# Patient Record
Sex: Female | Born: 1990 | Race: Black or African American | Hispanic: No | Marital: Married | Smoking: Never smoker
Health system: Southern US, Community
[De-identification: ages and names within clinical notes are randomized; demographics above are authoritative.]

## PROBLEM LIST (undated history)

## (undated) DIAGNOSIS — Z789 Other specified health status: Secondary | ICD-10-CM

## (undated) HISTORY — DX: Other specified health status: Z78.9

## (undated) HISTORY — PX: NO PAST SURGERIES: SHX2092

---

## 2019-04-26 ENCOUNTER — Ambulatory Visit: Payer: Self-pay | Attending: Family

## 2019-04-26 DIAGNOSIS — Z23 Encounter for immunization: Secondary | ICD-10-CM

## 2019-04-26 NOTE — Progress Notes (Signed)
   Covid-19 Vaccination Clinic  Name:  Leah Vargas    MRN: [id number] DOB: Jul 16, 1990  04/26/2019  Leah Vargas was observed post Covid-19 immunization for 15 minutes without incident. She was provided with Vaccine Information Sheet and instruction to access the V-Safe system.   Leah Vargas was instructed to call 911 with any severe reactions post vaccine: Marland Kitchen Difficulty breathing  . Swelling of face and throat  . A fast heartbeat  . A bad rash all over body  . Dizziness and weakness   Immunizations Administered    Name Date Dose VIS Date Route   Moderna COVID-19 Vaccine 04/26/2019 12:25 PM 0.5 mL 01/02/2019 Intramuscular   Manufacturer: Moderna   Lot: [id number]   NDC: 33825-053-97

## 2019-05-29 ENCOUNTER — Ambulatory Visit: Payer: Self-pay | Attending: Family

## 2019-05-29 DIAGNOSIS — Z23 Encounter for immunization: Secondary | ICD-10-CM

## 2019-05-29 NOTE — Progress Notes (Signed)
   Covid-19 Vaccination Clinic  Name:  [name]    MRN: [id number] DOB: [birth year]  05/29/2019  Leah Vargas was observed post Covid-19 immunization for 15 minutes without incident. She was provided with Vaccine Information Sheet and instruction to access the V-Safe system.   Leah Vargas was instructed to call 911 with any severe reactions post vaccine: Marland Kitchen Difficulty breathing  . Swelling of face and throat  . A fast heartbeat  . A bad rash all over body  . Dizziness and weakness   Immunizations Administered    Name Date Dose VIS Date Route   Moderna COVID-19 Vaccine 05/29/2019 12:08 PM 0.5 mL 01/2019 Intramuscular   Manufacturer: Moderna   Lot: [id number]   NDC: 77116-579-03

## 2020-10-05 ENCOUNTER — Encounter (HOSPITAL_COMMUNITY): Payer: Self-pay | Admitting: *Deleted

## 2020-10-05 ENCOUNTER — Ambulatory Visit (HOSPITAL_COMMUNITY)
Admission: EM | Admit: 2020-10-05 | Discharge: 2020-10-05 | Disposition: A | Discharge status: Home / Self Care | Payer: BC Managed Care – PPO

## 2020-10-05 DIAGNOSIS — Z3A01 Less than 8 weeks gestation of pregnancy: Secondary | ICD-10-CM | POA: Diagnosis not present

## 2020-10-05 DIAGNOSIS — O219 Vomiting of pregnancy, unspecified: Secondary | ICD-10-CM | POA: Diagnosis not present

## 2020-10-05 MED ORDER — ONDANSETRON 4 MG PO TBDP: 4.0000 mg | ORAL_TABLET | Freq: Three times a day (TID) | ORAL | 0 refills | Status: DC | PRN

## 2020-10-05 NOTE — Discharge Instructions (Addendum)
Take the zofran as needed for nausea and vomiting.   You can take Tylenol as needed for pain. Do not take and ibuprofen or naproxen. Refer to the sheet of medications that are safe to take during pregnancy.   Try to eat small, frequent meals to help with nausea.  You can use ginger (ginger ale, ginger candy) and mint for nausea.    Make sure you are drinking plenty of fluids, such as water, powerade/gatorade, pedialyte, juices, and teas.    Go to the emergency department for further evaluation of any worsening symptoms.  If you develop any abdominal pain, vaginal bleeding, or spotting, please go to the Maternal Admissions Unit.   Follow up with your OBGYN as scheduled.

## 2020-10-05 NOTE — ED Provider Notes (Signed)
[facility]    CSN: [id number] Arrival date & time: 10/05/20  1059      History   Chief Complaint Chief Complaint  Patient presents with   Emesis    Reports [redacted] wks pregnant   Dizziness    HPI Leah Vargas is a 30 y.o. female.   Yellow great thank you patient here for evaluation of vomiting and weakness that have gotten progressively worse over the past several weeks.  Patient does report being about [redacted] weeks pregnant with a LMP of 08/12/2020.  Reports being unable to keep anything down other than water.  Denies any abdominal pain or cramping, vaginal bleeding, or spotting.  Denies any trauma, injury, or other precipitating event.  Denies any specific alleviating or aggravating factors.  Denies any fevers, chest pain, shortness of breath, numbness, tingling, abdominal pain, or headaches.    The history is provided by the patient.  Emesis Dizziness Associated symptoms: nausea and vomiting    History reviewed. No pertinent past medical history.  There are no problems to display for this patient.   History reviewed. No pertinent surgical history.  OB History     Gravida  2   Para  0   Term      Preterm      AB      Living         SAB      IAB      Ectopic      Multiple      Live Births               Home Medications    Prior to Admission medications   Medication Sig Start Date End Date Taking? Authorizing Provider  ondansetron (ZOFRAN ODT) 4 MG disintegrating tablet Take 1 tablet (4 mg total) by mouth every 8 (eight) hours as needed for nausea or vomiting. 10/05/20  Yes Ivette Loyal, NP  Prenatal Vit-Fe Fumarate-FA (PRENATAL VITAMINS PO) Take by mouth. States hasn't been able to keep them down this past wk   Yes [provider]    Family History Family History  Problem Relation Age of Onset   Healthy Mother    Healthy Father     Social History Social History   Tobacco Use   Smoking status: Never   Smokeless  tobacco: Never  Vaping Use   Vaping Use: Never used  Substance Use Topics   Alcohol use: Not Currently   Drug use: Never     Allergies   Patient has no known allergies.   Review of Systems Review of Systems  Gastrointestinal:  Positive for nausea and vomiting.  Neurological:  Positive for dizziness.  All other systems reviewed and are negative.   Physical Exam Triage Vital Signs ED Triage Vitals  Enc Vitals Group     BP 10/05/20 1150 105/68     Pulse Rate 10/05/20 1150 81     Resp --      Temp 10/05/20 1150 99.2 F (37.3 C)     Temp Source 10/05/20 1150 Oral     SpO2 10/05/20 1150 99 %     Weight --      Height --      Head Circumference --      Peak Flow --      Pain Score 10/05/20 1155 0     Pain Loc --      Pain Edu? --      Excl. in GC? --  No data found.  Updated Vital Signs BP 105/68 (BP Location: Right Arm)   Pulse 81   Temp 99.2 F (37.3 C) (Oral)   LMP 08/12/2020 (Exact Date)   SpO2 99%   Visual Acuity Right Eye Distance:   Left Eye Distance:   Bilateral Distance:    Right Eye Near:   Left Eye Near:    Bilateral Near:     Physical Exam Vitals and nursing note reviewed.  Constitutional:      General: She is not in acute distress.    Appearance: Normal appearance. She is not ill-appearing, toxic-appearing or diaphoretic.  HENT:     Head: Normocephalic and atraumatic.  Eyes:     Conjunctiva/sclera: Conjunctivae normal.  Cardiovascular:     Rate and Rhythm: Normal rate.     Pulses: Normal pulses.     Heart sounds: Normal heart sounds.  Pulmonary:     Effort: Pulmonary effort is normal.     Breath sounds: Normal breath sounds.  Abdominal:     General: Abdomen is flat.  Musculoskeletal:        General: Normal range of motion.     Cervical back: Normal range of motion.  Skin:    General: Skin is warm and dry.  Neurological:     General: No focal deficit present.     Mental Status: She is alert and oriented to person, place, and  time.  Psychiatric:        Mood and Affect: Mood normal.     UC Treatments / Results  Labs (all labs ordered are listed, but only abnormal results are displayed) Labs Reviewed - No data to display  EKG   Radiology No results found.  Procedures Procedures (including critical care time)  Medications Ordered in UC Medications - No data to display  Initial Impression / Assessment and Plan / UC Course  I have reviewed the triage vital signs and the nursing notes.  Pertinent labs & imaging results that were available during my care of the patient were reviewed by me and considered in my medical decision making (see chart for details).    Assessment negative for red flags or concerns.  Nausea and vomiting during pregnancy.  May take Zofran as needed to help with nausea and vomiting.  May take Tylenol as needed for pain.  Instructed not to take NSAIDs while pregnant.  Patient given list of medications that are safe to take during pregnancy to help with symptoms.  Recommend eating small frequent meals and can use ginger or mint to help with nausea.  Encourage fluids and rest.  If symptoms do not improve or worsen patient may go to the emergency room for further evaluation.  Patient instructed to go to MAU for any abdominal pain, vaginal bleeding or spotting.  Follow-up with OB/GYN as scheduled Final Clinical Impressions(s) / UC Diagnoses   Final diagnoses:  Nausea and vomiting during pregnancy     Discharge Instructions      Take the zofran as needed for nausea and vomiting.   You can take Tylenol as needed for pain. Do not take and ibuprofen or naproxen. Refer to the sheet of medications that are safe to take during pregnancy.   Try to eat small, frequent meals to help with nausea.  You can use ginger (ginger ale, ginger candy) and mint for nausea.    Make sure you are drinking plenty of fluids, such as water, powerade/gatorade, pedialyte, juices, and teas.    Go to  the  emergency department for further evaluation of any worsening symptoms.  If you develop any abdominal pain, vaginal bleeding, or spotting, please go to the Maternal Admissions Unit.   Follow up with your OBGYN as scheduled.      ED Prescriptions     Medication Sig Dispense Auth. Provider   ondansetron (ZOFRAN ODT) 4 MG disintegrating tablet Take 1 tablet (4 mg total) by mouth every 8 (eight) hours as needed for nausea or vomiting. 20 tablet Ivette Loyal, NP      PDMP not reviewed this encounter.   Ivette Loyal, NP 10/05/20 1226

## 2020-10-05 NOTE — ED Triage Notes (Signed)
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 C/O dizziness, vomiting intermittently x 2 wks with progressive worsening over the past week.  States now unable to keep down any PO fluids now.  States has OB appt scheduled.

## 2020-10-16 ENCOUNTER — Inpatient Hospital Stay (HOSPITAL_COMMUNITY)
Admission: EM | Admit: 2020-10-16 | Discharge: 2020-10-17 | Disposition: A | Discharge status: Home / Self Care | Payer: BC Managed Care – PPO | Attending: Family Medicine | Admitting: Family Medicine

## 2020-10-16 ENCOUNTER — Encounter (HOSPITAL_COMMUNITY): Payer: Self-pay

## 2020-10-16 ENCOUNTER — Other Ambulatory Visit: Payer: Self-pay

## 2020-10-16 ENCOUNTER — Ambulatory Visit (HOSPITAL_COMMUNITY)
Admission: EM | Admit: 2020-10-16 | Discharge: 2020-10-16 | Disposition: A | Discharge status: Home / Self Care | Payer: BC Managed Care – PPO

## 2020-10-16 DIAGNOSIS — K920 Hematemesis: Secondary | ICD-10-CM | POA: Insufficient documentation

## 2020-10-16 DIAGNOSIS — O26891 Other specified pregnancy related conditions, first trimester: Secondary | ICD-10-CM | POA: Diagnosis present

## 2020-10-16 DIAGNOSIS — Z3A09 9 weeks gestation of pregnancy: Secondary | ICD-10-CM | POA: Diagnosis not present

## 2020-10-16 DIAGNOSIS — O99281 Endocrine, nutritional and metabolic diseases complicating pregnancy, first trimester: Secondary | ICD-10-CM | POA: Diagnosis not present

## 2020-10-16 DIAGNOSIS — O219 Vomiting of pregnancy, unspecified: Secondary | ICD-10-CM | POA: Diagnosis not present

## 2020-10-16 DIAGNOSIS — Z79899 Other long term (current) drug therapy: Secondary | ICD-10-CM | POA: Insufficient documentation

## 2020-10-16 DIAGNOSIS — E860 Dehydration: Secondary | ICD-10-CM | POA: Insufficient documentation

## 2020-10-16 DIAGNOSIS — O211 Hyperemesis gravidarum with metabolic disturbance: Secondary | ICD-10-CM | POA: Diagnosis not present

## 2020-10-16 LAB — I-STAT BETA HCG BLOOD, ED (MC, WL, AP ONLY): I-stat hCG, quantitative: >2000 m[IU]/mL — ABNORMAL HIGH (ref <5)

## 2020-10-16 MED ORDER — LACTATED RINGERS IV SOLN: Freq: Once | INTRAVENOUS | Status: AC

## 2020-10-16 MED ORDER — SODIUM CHLORIDE 0.9 % IV SOLN
12.5000 mg | INTRAVENOUS | Status: AC
  Administered 2020-10-16: 12.5 mg via INTRAVENOUS
  Filled 2020-10-16: qty 0.5

## 2020-10-16 NOTE — MAU Provider Note (Signed)
Chief Complaint: Nausea and Hematemesis   Event Date/Time   First Provider Initiated Contact with Patient 10/16/20 2224        SUBJECTIVE HPI: Leah Vargas is a 30 y.o. G1P0 at [redacted]w[redacted]d by LMP who presents to maternity admissions reporting nausea and vomiting for 3 weeks.  Was taking Zofran but ran out of it.  . She denies vaginal bleeding, vaginal itching/burning, urinary symptoms, h/a, dizziness, or fever/chills.    Emesis  This is a recurrent problem. The current episode started 1 to 4 weeks ago. There has been no fever. Pertinent negatives include no abdominal pain, chest pain, chills, coughing, diarrhea, dizziness, fever, headaches or myalgias. Treatments tried: zofran but it ran out.  RN Note: Pt transferred from [facility] with c/o N/V. Pt reports she has had N/V x 3 weeks. Was seen in urgent care and given zofran that helped for a bout a week then it stopped working. Can't keep anything down. Feels week. Stated she has had blood streaking in her emesis as well.   History reviewed. No pertinent past medical history. History reviewed. No pertinent surgical history. Social History   Socioeconomic History   Marital status: Single    Spouse name: Not on file   Number of children: Not on file   Years of education: Not on file   Highest education level: Not on file  Occupational History   Not on file  Tobacco Use   Smoking status: Never   Smokeless tobacco: Never  Vaping Use   Vaping Use: Never used  Substance and Sexual Activity   Alcohol use: Not Currently   Drug use: Never   Sexual activity: Not Currently    Comment: reports currently pregnant  Other Topics Concern   Not on file  Social History Narrative   Not on file   Social Determinants of Health   Financial Resource Strain: Not on file  Food Insecurity: Not on file  Transportation Needs: Not on file  Physical Activity: Not on file  Stress: Not on file  Social Connections: Not on file  Intimate Partner Violence:  Not on file   No current facility-administered medications on file prior to encounter.   Current Outpatient Medications on File Prior to Encounter  Medication Sig Dispense Refill   ondansetron (ZOFRAN ODT) 4 MG disintegrating tablet Take 1 tablet (4 mg total) by mouth every 8 (eight) hours as needed for nausea or vomiting. 20 tablet 0   Prenatal Vit-Fe Fumarate-FA (PRENATAL VITAMINS PO) Take by mouth. States hasn't been able to keep them down this past wk     No Known Allergies  I have reviewed patient's Past Medical Hx, Surgical Hx, Family Hx, Social Hx, medications and allergies.   ROS:  Review of Systems  Constitutional:  Negative for chills and fever.  Respiratory:  Negative for cough.   Cardiovascular:  Negative for chest pain.  Gastrointestinal:  Positive for vomiting. Negative for abdominal pain and diarrhea.  Musculoskeletal:  Negative for myalgias.  Neurological:  Negative for dizziness and headaches.  Review of Systems  Other systems negative   Physical Exam  Physical Exam Patient Vitals for the past 24 hrs:  BP Temp Temp src Pulse Resp SpO2  10/16/20 2217 130/69 -- Oral 79 20 100 %  10/16/20 2203 (!) 141/84 -- -- 97 18 --  10/16/20 1944 (!) 107/96 98.3 F (36.8 C) -- (!) 116 20 100 %   Constitutional: Well-developed, well-nourished female in no acute distress.  Cardiovascular: normal rate with intermittent  tachycardia Respiratory: normal effort GI: Abd soft, non-tender. Pos BS x 4 MS: Extremities nontender, no edema, normal ROM Neurologic: Alert and oriented x 4.  GU: Neg CVAT.  LAB RESULTS Results for orders placed or performed during the hospital encounter of 10/16/20 (from the past 24 hour(s))  I-Stat beta hCG blood, ED     Status: Abnormal   Collection Time: 10/16/20  8:01 PM  Result Value Ref Range   I-stat hCG, quantitative >2,000.0 (H) <5 mIU/mL   Comment 3               IMAGING Pt informed that the ultrasound is considered a limited OB  ultrasound and is not intended to be a complete ultrasound exam.  Patient also informed that the ultrasound is not being completed with the intent of assessing for fetal or placental anomalies or any pelvic abnormalities.  Explained that the purpose of today's ultrasound is to assess for viability Patient acknowledges the purpose of the exam and the limitations of the study.    Single intrauterine pregnancy seen GS normal  Single embryo measuring [redacted]w[redacted]d FHR 160 ? Membrane seen inside GS  MAU Management/MDM: Ordered IV fluids for rehydration 2 liters given Phenergan 12.5mg  given  Patient got good relief and was without further vomiting States she feels better and wants to go home   ASSESSMENT Single IUP at.[redacted]w[redacted]d Nausea and vomiting Dehydration  PLAN Discharge home Rx Zofran refilled Rx Phenergan for nausea prn at home Advance diet as tolerated Has appt at [facility] 11/10/20  Pt stable at time of discharge. Encouraged to return here if she develops worsening of symptoms, increase in pain, fever, or other concerning symptoms.    Wynelle Bourgeois CNM, MSN Certified Nurse-Midwife 10/16/2020  10:24 PM

## 2020-10-16 NOTE — ED Triage Notes (Signed)
Pt from urgent care d/t nausea and vomiting that's been ongoing for the past 2 weeks. Patient endorses blood in vomit. Patient is currently [redacted] wks pregnant. Patient also endorsing chest pain at this time.

## 2020-10-16 NOTE — MAU Note (Signed)
Pt transferred from [facility] with c/o N/V. Pt reports she has had N/V x 3 weeks. Was seen in urgent care and given zofran that helped for a bout a week then it stopped working. Can't keep anything down. Feels week. Stated she has had blood streaking in her emesis as well.

## 2020-10-16 NOTE — ED Notes (Signed)
Per Forest Becker, MD pt is to be evaluated to ED.

## 2020-10-16 NOTE — ED Provider Notes (Signed)
Emergency Medicine Provider Triage Evaluation Note  [name] , a 30 y.o. female  was evaluated in triage.  Pt complains of nv in pregnancy. States she is about [redacted] weeks pregnant.  Review of Systems  Positive: nv Negative: Vaginal bleeding  Physical Exam  BP (!) 107/96 (BP Location: Left Arm)   Pulse (!) 116   Temp 98.3 F (36.8 C)   Resp 20   LMP 08/12/2020 (Exact Date)   SpO2 100%  Gen:   Awake, no distress   Resp:  Normal effort  MSK:   Moves extremities without difficulty  Other:  Actively vomiting on exam  Medical Decision Making  Medically screening exam initiated at 7:46 PM.  Appropriate orders placed.  Oluwatamilore Duncan Dull was informed that the remainder of the evaluation will be completed by another provider, this initial triage assessment does not replace that evaluation, and the importance of remaining in the ED until their evaluation is complete.  7:46 PM Discussed case with Dr. Lupita Leash who accepts patient for transfer to the MAU pending istat beta hcg   Karrie Meres, PA-C 10/16/20 1948    Mancel Bale, MD 10/18/20 1151

## 2020-10-17 MED ORDER — DEXTROSE IN LACTATED RINGERS 5 % IV SOLN: Freq: Once | INTRAVENOUS | Status: AC

## 2020-10-17 MED ORDER — PROMETHAZINE HCL 25 MG PO TABS: 25.0000 mg | ORAL_TABLET | Freq: Four times a day (QID) | ORAL | 2 refills | Status: DC | PRN

## 2020-10-17 MED ORDER — ONDANSETRON HCL 8 MG PO TABS: 4.0000 mg | ORAL_TABLET | Freq: Three times a day (TID) | ORAL | 0 refills | Status: DC | PRN

## 2020-10-17 NOTE — MAU Note (Signed)
Patient unable to obtain urine. MAU provider notified.

## 2020-10-19 ENCOUNTER — Encounter (HOSPITAL_COMMUNITY): Payer: Self-pay | Admitting: Obstetrics & Gynecology

## 2020-10-19 ENCOUNTER — Other Ambulatory Visit: Payer: Self-pay

## 2020-10-19 ENCOUNTER — Inpatient Hospital Stay (HOSPITAL_COMMUNITY)
Admission: AD | Admit: 2020-10-19 | Discharge: 2020-10-19 | Disposition: A | Discharge status: Home / Self Care | Payer: BC Managed Care – PPO | Attending: Obstetrics & Gynecology | Admitting: Obstetrics & Gynecology

## 2020-10-19 DIAGNOSIS — Z3A09 9 weeks gestation of pregnancy: Secondary | ICD-10-CM | POA: Insufficient documentation

## 2020-10-19 DIAGNOSIS — O210 Mild hyperemesis gravidarum: Secondary | ICD-10-CM | POA: Insufficient documentation

## 2020-10-19 LAB — CBC
HCT: 34.4 % — ABNORMAL LOW (ref 36.0–46.0)
Hemoglobin: 11 g/dL — ABNORMAL LOW (ref 12.0–15.0)
MCH: 23.6 pg — ABNORMAL LOW (ref 26.0–34.0)
MCHC: 32 g/dL (ref 30.0–36.0)
MCV: 73.7 fL — ABNORMAL LOW (ref 80.0–100.0)
Platelets: 309 10*3/uL (ref 150–400)
RBC: 4.67 MIL/uL (ref 3.87–5.11)
RDW: 18.5 % — ABNORMAL HIGH (ref 11.5–15.5)
WBC: 6 10*3/uL (ref 4.0–10.5)
nRBC: 0 % (ref 0.0–0.2)

## 2020-10-19 LAB — URINALYSIS, ROUTINE W REFLEX MICROSCOPIC
Glucose, UA: NEGATIVE mg/dL
Ketones, ur: >80 mg/dL — AB
Nitrite: NEGATIVE
Protein, ur: NEGATIVE mg/dL
Specific Gravity, Urine: >1.03 — ABNORMAL HIGH (ref 1.005–1.030)
pH: 6 (ref 5.0–8.0)

## 2020-10-19 LAB — COMPREHENSIVE METABOLIC PANEL WITH GFR
ALT: 9 U/L (ref 0–44)
AST: 16 U/L (ref 15–41)
Albumin: 3.9 g/dL (ref 3.5–5.0)
Alkaline Phosphatase: 35 U/L — ABNORMAL LOW (ref 38–126)
Anion gap: 15 (ref 5–15)
BUN: 6 mg/dL (ref 6–20)
CO2: 16 mmol/L — ABNORMAL LOW (ref 22–32)
Calcium: 9.6 mg/dL (ref 8.9–10.3)
Chloride: 106 mmol/L (ref 98–111)
Creatinine, Ser: 0.73 mg/dL (ref 0.44–1.00)
GFR, Estimated: >60 mL/min
Glucose, Bld: 70 mg/dL (ref 70–99)
Potassium: 3.3 mmol/L — ABNORMAL LOW (ref 3.5–5.1)
Sodium: 137 mmol/L (ref 135–145)
Total Bilirubin: 0.6 mg/dL (ref 0.3–1.2)
Total Protein: 8.2 g/dL — ABNORMAL HIGH (ref 6.5–8.1)

## 2020-10-19 LAB — URINALYSIS, MICROSCOPIC (REFLEX)

## 2020-10-19 MED ORDER — FAMOTIDINE IN NACL 20-0.9 MG/50ML-% IV SOLN
20.0000 mg | Freq: Once | INTRAVENOUS | Status: AC
  Administered 2020-10-19: 20 mg via INTRAVENOUS
  Filled 2020-10-19: qty 50

## 2020-10-19 MED ORDER — METHYLPREDNISOLONE 16 MG PO TABS: 16.0000 mg | ORAL_TABLET | Freq: Every day | ORAL | 0 refills | Status: DC

## 2020-10-19 MED ORDER — ONDANSETRON 4 MG PO TBDP
8.0000 mg | ORAL_TABLET | Freq: Once | ORAL | Status: AC
  Administered 2020-10-19: 8 mg via ORAL
  Filled 2020-10-19: qty 2

## 2020-10-19 MED ORDER — PROMETHAZINE HCL 25 MG RE SUPP: RECTAL | 1 refills | Status: DC

## 2020-10-19 MED ORDER — METHYLPREDNISOLONE 4 MG PO TABS
4.0000 mg | ORAL_TABLET | Freq: Every day | ORAL | Status: DC
Start: 2020-10-26 — End: 2020-10-20

## 2020-10-19 MED ORDER — SCOPOLAMINE 1 MG/3DAYS TD PT72: 1.0000 | MEDICATED_PATCH | TRANSDERMAL | 12 refills | Status: DC

## 2020-10-19 MED ORDER — PROCHLORPERAZINE EDISYLATE 10 MG/2ML IJ SOLN
10.0000 mg | Freq: Once | INTRAMUSCULAR | Status: AC
  Administered 2020-10-19: 10 mg via INTRAVENOUS
  Filled 2020-10-19: qty 2

## 2020-10-19 MED ORDER — METHYLPREDNISOLONE 4 MG PO TBPK: ORAL_TABLET | ORAL | 0 refills | Status: DC

## 2020-10-19 MED ORDER — METHYLPREDNISOLONE 4 MG PO TABS
8.0000 mg | ORAL_TABLET | Freq: Every day | ORAL | Status: DC
Start: 2020-10-22 — End: 2020-10-20

## 2020-10-19 MED ORDER — METHYLPREDNISOLONE 8 MG PO TABS: 8.0000 mg | ORAL_TABLET | Freq: Every day | ORAL | 0 refills | Status: DC

## 2020-10-19 MED ORDER — METHYLPREDNISOLONE 4 MG PO TABS
4.0000 mg | ORAL_TABLET | Freq: Every day | ORAL | Status: DC
Start: 2020-10-31 — End: 2020-10-20

## 2020-10-19 MED ORDER — METOCLOPRAMIDE HCL 5 MG/ML IJ SOLN
10.0000 mg | Freq: Once | INTRAMUSCULAR | Status: AC
  Administered 2020-10-19: 10 mg via INTRAVENOUS
  Filled 2020-10-19: qty 2

## 2020-10-19 MED ORDER — LACTATED RINGERS IV BOLUS
1000.0000 mL | Freq: Once | INTRAVENOUS | Status: AC
  Administered 2020-10-19: 1000 mL via INTRAVENOUS

## 2020-10-19 MED ORDER — METHYLPREDNISOLONE 4 MG PO TABS: 4.0000 mg | ORAL_TABLET | Freq: Every day | ORAL | Status: DC

## 2020-10-19 MED ORDER — ONDANSETRON 8 MG PO TBDP: 8.0000 mg | ORAL_TABLET | Freq: Three times a day (TID) | ORAL | 0 refills | Status: DC | PRN

## 2020-10-19 MED ORDER — METHYLPREDNISOLONE 16 MG PO TABS: 16.0000 mg | ORAL_TABLET | Freq: Every day | ORAL | Status: DC

## 2020-10-19 MED ORDER — METHYLPREDNISOLONE SODIUM SUCC 125 MG IJ SOLR
48.0000 mg | Freq: Once | INTRAMUSCULAR | Status: AC
Start: 2020-10-19 — End: 2020-10-19
  Administered 2020-10-19: 48 mg via INTRAVENOUS
  Filled 2020-10-19: qty 2

## 2020-10-19 MED ORDER — METHYLPREDNISOLONE 4 MG PO TABS
8.0000 mg | ORAL_TABLET | Freq: Every day | ORAL | Status: DC
Start: 2020-10-23 — End: 2020-10-20

## 2020-10-19 MED ORDER — METHYLPREDNISOLONE 4 MG PO TABS: 4.0000 mg | ORAL_TABLET | Freq: Every day | ORAL | 0 refills | Status: DC

## 2020-10-19 MED ORDER — METHYLPREDNISOLONE 4 MG PO TABS
4.0000 mg | ORAL_TABLET | Freq: Every day | ORAL | 0 refills | Status: DC
Start: 2020-10-26 — End: 2020-10-19

## 2020-10-19 MED ORDER — SCOPOLAMINE 1 MG/3DAYS TD PT72
1.0000 | MEDICATED_PATCH | TRANSDERMAL | Status: DC
  Administered 2020-10-19: 1.5 mg via TRANSDERMAL
  Filled 2020-10-19: qty 1

## 2020-10-19 MED ORDER — METHYLPREDNISOLONE 4 MG PO TABS: 8.0000 mg | ORAL_TABLET | Freq: Every day | ORAL | Status: DC

## 2020-10-19 MED ORDER — PROMETHAZINE HCL 25 MG/ML IJ SOLN
25.0000 mg | Freq: Once | INTRAVENOUS | Status: AC
  Administered 2020-10-19: 25 mg via INTRAVENOUS
  Filled 2020-10-19: qty 1

## 2020-10-19 MED ORDER — LACTATED RINGERS IV SOLN: INTRAVENOUS | Status: DC

## 2020-10-19 NOTE — MAU Provider Note (Signed)
Patient Leah Vargas is a 30 y.o. G1P0  At [redacted]w[redacted]d here with complaints of nausea and vomiting. She was seen in MAU on 10/16/2020; was sent home with Phenergan and zofran but says that she has not been able to keep anything down. She denies fever, vag bleeding, pelvic pain. She denies dsyuria, she denies cough, HA    History     CSN: [id number]  Arrival date and time: 10/19/20 1321   Event Date/Time   First Provider Initiated Contact with Patient 10/19/20 1414      Chief Complaint  Patient presents with   Nausea   Emesis   Emesis  This is a chronic problem. The current episode started in the past 7 days. The problem occurs 5 to 10 times per day. The problem has been gradually worsening. The emesis has an appearance of bile and stomach contents. There has been no fever. Pertinent negatives include no abdominal pain, arthralgias, chest pain, chills, diarrhea, dizziness, fever or headaches. Treatments tried: zofran and phenergan.   OB History     Gravida  1   Para  0   Term      Preterm      AB      Living         SAB      IAB      Ectopic      Multiple      Live Births              History reviewed. No pertinent past medical history.  No past surgical history on file.  Family History  Problem Relation Age of Onset   Healthy Mother    Healthy Father     Social History   Tobacco Use   Smoking status: Never   Smokeless tobacco: Never  Vaping Use   Vaping Use: Never used  Substance Use Topics   Alcohol use: Not Currently   Drug use: Never    Allergies: No Known Allergies  Medications Prior to Admission  Medication Sig Dispense Refill Last Dose   ondansetron (ZOFRAN) 8 MG tablet Take 0.5 tablets (4 mg total) by mouth every 8 (eight) hours as needed for nausea or vomiting. 20 tablet 0    Prenatal Vit-Fe Fumarate-FA (PRENATAL VITAMINS PO) Take by mouth. States hasn't been able to keep them down this past wk      promethazine (PHENERGAN) 25  MG tablet Take 1 tablet (25 mg total) by mouth every 6 (six) hours as needed for nausea or vomiting. 30 tablet 2     Review of Systems  Constitutional:  Negative for chills and fever.  HENT: Negative.    Respiratory: Negative.    Cardiovascular:  Negative for chest pain.  Gastrointestinal:  Positive for vomiting. Negative for abdominal pain and diarrhea.  Genitourinary: Negative.  Negative for vaginal bleeding and vaginal discharge.  Musculoskeletal:  Negative for arthralgias.  Neurological: Negative.  Negative for dizziness and headaches.  Psychiatric/Behavioral: Negative.    Physical Exam   Blood pressure 112/60, pulse (!) 110, temperature 98.1 F (36.7 C), temperature source Oral, resp. rate 16, height 5\' 4"  (1.626 m), weight 67.3 kg, last menstrual period 08/12/2020, SpO2 100 %.  Physical Exam Constitutional:      Appearance: Normal appearance.  Pulmonary:     Effort: Pulmonary effort is normal.  Musculoskeletal:        General: Normal range of motion.  Skin:    General: Skin is warm and dry.  Neurological:  Mental Status: She is alert.  Psychiatric:        Mood and Affect: Mood normal.        Behavior: Behavior normal.    MAU Course  Procedures  MDM Patient had two bags of fluid, ZOfran and phenergan. She is sleeping however upon arousal she began to vomit. Reglan, Pepcid and scop patch given and patient was able to tolerate sips of water.   CBC, CMP are normal, although UA shows ketones Patient Vitals for the past 24 hrs:  BP Temp Temp src Pulse Resp SpO2 Height Weight  10/19/20 2113 113/61 98.6 F (37 C) Oral 76 18 -- -- --  10/19/20 1344 112/60 98.1 F (36.7 C) Oral (!) 110 16 100 % -- --  10/19/20 1340 -- -- -- -- -- -- 5\' 4"  (1.626 m) 67.3 kg    Assessment and Plan   1. Morning sickness   -patient sent home with RX for Medrol dose pack. Detailed instructions given to patient's mother about starting steroid taper tomorrow -patient also given RX for  phenergan suppositories to be placed vaginally, as well continuing ODT zofran -recommend she try water and eating AFTER taking zofran  -return to MAU if she cannot keep down liquids but give medicine time to work (2-3 days) -next step would be admission or IV fluids outpatient -patient and mother verbalized understanding 10/19/2020, 2:16 PM

## 2020-10-19 NOTE — Discharge Instructions (Signed)
-  ok to just drink liquids, don't try to eat anything -when you do eat, try just crackers -don't eat anything heavy

## 2020-10-19 NOTE — MAU Note (Signed)
Leah Vargas is a 30 y.o. at [redacted]w[redacted]d here in MAU reporting: nausea and vomiting for the past 3 weeks. Was seen in MAU on 9/15. Last took phenergan last night, is unable to keep down zofran. No pain or bleeding.  Onset of complaint: ongoing  Pain score: 0/10  Vitals:   10/19/20 1344  BP: 112/60  Pulse: (!) 110  Resp: 16  Temp: 98.1 F (36.7 C)  SpO2: 100%     Lab orders placed from triage: UA

## 2020-10-20 ENCOUNTER — Telehealth: Payer: Self-pay

## 2020-10-20 NOTE — Telephone Encounter (Signed)
[facility] left VM on nurse line requesting clarification about orders placed by Catron, CNM. Called [facility]. They would like to know with methylprednisolone rx to fill. Explained that Medrol dose pack 4 mg is only rx remaining active; requested this be filled.

## 2020-10-23 ENCOUNTER — Observation Stay (HOSPITAL_COMMUNITY): Payer: BC Managed Care – PPO

## 2020-10-23 ENCOUNTER — Other Ambulatory Visit: Payer: Self-pay

## 2020-10-23 ENCOUNTER — Inpatient Hospital Stay (HOSPITAL_COMMUNITY)
Admission: AD | Admit: 2020-10-23 | Discharge: 2020-10-30 | DRG: 833 | Disposition: A | Discharge status: Home / Self Care | Payer: BC Managed Care – PPO | Attending: Obstetrics & Gynecology | Admitting: Obstetrics & Gynecology

## 2020-10-23 ENCOUNTER — Encounter (HOSPITAL_COMMUNITY): Payer: Self-pay | Admitting: Obstetrics & Gynecology

## 2020-10-23 DIAGNOSIS — O211 Hyperemesis gravidarum with metabolic disturbance: Secondary | ICD-10-CM | POA: Diagnosis not present

## 2020-10-23 DIAGNOSIS — O210 Mild hyperemesis gravidarum: Secondary | ICD-10-CM

## 2020-10-23 DIAGNOSIS — R1110 Vomiting, unspecified: Secondary | ICD-10-CM | POA: Diagnosis present

## 2020-10-23 DIAGNOSIS — E860 Dehydration: Secondary | ICD-10-CM | POA: Diagnosis not present

## 2020-10-23 DIAGNOSIS — R748 Abnormal levels of other serum enzymes: Secondary | ICD-10-CM | POA: Diagnosis present

## 2020-10-23 DIAGNOSIS — Z20822 Contact with and (suspected) exposure to covid-19: Secondary | ICD-10-CM | POA: Diagnosis present

## 2020-10-23 DIAGNOSIS — E876 Hypokalemia: Secondary | ICD-10-CM | POA: Diagnosis present

## 2020-10-23 DIAGNOSIS — Z3A11 11 weeks gestation of pregnancy: Secondary | ICD-10-CM

## 2020-10-23 DIAGNOSIS — Z3A10 10 weeks gestation of pregnancy: Secondary | ICD-10-CM

## 2020-10-23 HISTORY — DX: Vomiting, unspecified: R11.10

## 2020-10-23 LAB — COMPREHENSIVE METABOLIC PANEL
ALT: 11 U/L (ref 0–44)
AST: 21 U/L (ref 15–41)
Albumin: 4.2 g/dL (ref 3.5–5.0)
Alkaline Phosphatase: 39 U/L (ref 38–126)
Anion gap: 15 (ref 5–15)
BUN: 6 mg/dL (ref 6–20)
CO2: 22 mmol/L (ref 22–32)
Calcium: 10.1 mg/dL (ref 8.9–10.3)
Chloride: 101 mmol/L (ref 98–111)
Creatinine, Ser: 0.69 mg/dL (ref 0.44–1.00)
GFR, Estimated: >60 mL/min (ref >60)
Glucose, Bld: 81 mg/dL (ref 70–99)
Potassium: 2.9 mmol/L — ABNORMAL LOW (ref 3.5–5.1)
Sodium: 138 mmol/L (ref 135–145)
Total Bilirubin: 0.9 mg/dL (ref 0.3–1.2)
Total Protein: 8.8 g/dL — ABNORMAL HIGH (ref 6.5–8.1)

## 2020-10-23 LAB — CBC
HCT: 37.8 % (ref 36.0–46.0)
Hemoglobin: 12.2 g/dL (ref 12.0–15.0)
MCH: 23.6 pg — ABNORMAL LOW (ref 26.0–34.0)
MCHC: 32.3 g/dL (ref 30.0–36.0)
MCV: 73.3 fL — ABNORMAL LOW (ref 80.0–100.0)
Platelets: 350 10*3/uL (ref 150–400)
RBC: 5.16 MIL/uL — ABNORMAL HIGH (ref 3.87–5.11)
RDW: 18.8 % — ABNORMAL HIGH (ref 11.5–15.5)
WBC: 6.6 10*3/uL (ref 4.0–10.5)
nRBC: 0 % (ref 0.0–0.2)

## 2020-10-23 LAB — AMYLASE: Amylase: 106 U/L — ABNORMAL HIGH (ref 28–100)

## 2020-10-23 LAB — BASIC METABOLIC PANEL
Anion gap: 5 (ref 5–15)
BUN: <5 mg/dL — ABNORMAL LOW (ref 6–20)
CO2: 23 mmol/L (ref 22–32)
Calcium: 8.8 mg/dL — ABNORMAL LOW (ref 8.9–10.3)
Chloride: 107 mmol/L (ref 98–111)
Creatinine, Ser: 0.46 mg/dL (ref 0.44–1.00)
GFR, Estimated: >60 mL/min (ref >60)
Glucose, Bld: 170 mg/dL — ABNORMAL HIGH (ref 70–99)
Potassium: 3.3 mmol/L — ABNORMAL LOW (ref 3.5–5.1)
Sodium: 135 mmol/L (ref 135–145)

## 2020-10-23 LAB — RESP PANEL BY RT-PCR (FLU A&B, COVID) ARPGX2
Influenza A by PCR: NEGATIVE
Influenza B by PCR: NEGATIVE
SARS Coronavirus 2 by RT PCR: NEGATIVE

## 2020-10-23 LAB — LIPASE, BLOOD: Lipase: 26 U/L (ref 11–51)

## 2020-10-23 MED ORDER — LACTATED RINGERS IV SOLN: Freq: Once | INTRAVENOUS | Status: AC

## 2020-10-23 MED ORDER — FAMOTIDINE 20 MG IN NS 100 ML IVPB
20.0000 mg | Freq: Once | INTRAVENOUS | Status: AC
  Administered 2020-10-23: 20 mg via INTRAVENOUS
  Filled 2020-10-23: qty 100

## 2020-10-23 MED ORDER — FAMOTIDINE IN NACL 20-0.9 MG/50ML-% IV SOLN
20.0000 mg | Freq: Two times a day (BID) | INTRAVENOUS | Status: DC
  Filled 2020-10-23 (×4): qty 50

## 2020-10-23 MED ORDER — ACETAMINOPHEN 325 MG PO TABS: 650.0000 mg | ORAL_TABLET | ORAL | Status: DC | PRN

## 2020-10-23 MED ORDER — ZOLPIDEM TARTRATE 5 MG PO TABS
5.0000 mg | ORAL_TABLET | Freq: Every evening | ORAL | Status: DC | PRN
Start: 2020-10-23 — End: 2020-10-25

## 2020-10-23 MED ORDER — FAMOTIDINE 20 MG IN NS 100 ML IVPB
20.0000 mg | Freq: Two times a day (BID) | INTRAVENOUS | Status: DC
  Administered 2020-10-23 – 2020-10-30 (×15): 20 mg via INTRAVENOUS
  Filled 2020-10-23 (×13): qty 100

## 2020-10-23 MED ORDER — PYRIDOXINE HCL 100 MG/ML IJ SOLN
100.0000 mg | Freq: Once | INTRAMUSCULAR | Status: AC
  Administered 2020-10-23: 100 mg via INTRAVENOUS
  Filled 2020-10-23: qty 1

## 2020-10-23 MED ORDER — CALCIUM CARBONATE ANTACID 500 MG PO CHEW: 2.0000 | CHEWABLE_TABLET | ORAL | Status: DC | PRN

## 2020-10-23 MED ORDER — SODIUM CHLORIDE 0.9 % IV SOLN
12.5000 mg | Freq: Four times a day (QID) | INTRAVENOUS | Status: DC | PRN
  Filled 2020-10-23 (×2): qty 0.5

## 2020-10-23 MED ORDER — PRENATAL MULTIVITAMIN CH
1.0000 | ORAL_TABLET | Freq: Every day | ORAL | Status: DC
Start: 2020-10-23 — End: 2020-10-30
  Administered 2020-10-30: 1 via ORAL
  Filled 2020-10-23 (×2): qty 1

## 2020-10-23 MED ORDER — SODIUM CHLORIDE 0.9 % IV SOLN
25.0000 mg | Freq: Once | INTRAVENOUS | Status: AC
  Administered 2020-10-23: 25 mg via INTRAVENOUS
  Filled 2020-10-23: qty 1

## 2020-10-23 MED ORDER — KCL-LACTATED RINGERS-D5W 20 MEQ/L IV SOLN
INTRAVENOUS | Status: DC
Start: 2020-10-23 — End: 2020-10-30
  Administered 2020-10-23 – 2020-10-25 (×4): 200 mL/h via INTRAVENOUS
  Filled 2020-10-23 (×25): qty 1000

## 2020-10-23 MED ORDER — METHYLPREDNISOLONE SODIUM SUCC 125 MG IJ SOLR
40.0000 mg | Freq: Two times a day (BID) | INTRAMUSCULAR | Status: DC
  Administered 2020-10-23 – 2020-10-25 (×5): 40 mg via INTRAVENOUS
  Filled 2020-10-23 (×6): qty 2

## 2020-10-23 MED ORDER — KCL-LACTATED RINGERS-D5W 20 MEQ/L IV SOLN
Freq: Once | INTRAVENOUS | Status: DC
  Filled 2020-10-23: qty 1000

## 2020-10-23 MED ORDER — DOCUSATE SODIUM 100 MG PO CAPS: 100.0000 mg | ORAL_CAPSULE | Freq: Every day | ORAL | Status: DC

## 2020-10-23 NOTE — MAU Note (Signed)
Unable to keep down anything for several days. Have tried all the meds but nothing is helping and won't stay down. Denies VB or d/c. Burning in chest from vomitng

## 2020-10-23 NOTE — MAU Provider Note (Signed)
Chief Complaint: Emesis During Pregnancy   Event Date/Time   First Provider Initiated Contact with Patient 10/23/20 0105        SUBJECTIVE HPI: Leah Vargas is a 30 y.o. G1P0 at [redacted]w[redacted]d by LMP who presents to maternity admissions reporting persistent vomiting despite medications.  Was seen here 10/16/20 and 10/19/20 for same thing   Multiple medications have been tried.  Recently they added Phenergan suppositories, scop patch, and Medrol taper.   States did well until 2 days ago.  She denies vaginal bleeding, h/a, or fever/chills.    Emesis  This is a recurrent problem. The current episode started 1 to 4 weeks ago. The problem occurs more than 10 times per day. The problem has been unchanged. There has been no fever. Pertinent negatives include no abdominal pain, chest pain, chills, diarrhea, dizziness, fever or myalgias. Treatments tried: Phenergan, scopolamine, zofran and Medrol. The treatment provided no relief.  RN Note: Unable to keep down anything for several days. Have tried all the meds but nothing is helping and won't stay down. Denies VB or d/c. Burning in chest from vomitng  History reviewed. No pertinent past medical history. History reviewed. No pertinent surgical history. Social History   Socioeconomic History   Marital status: Single    Spouse name: Not on file   Number of children: Not on file   Years of education: Not on file   Highest education level: Not on file  Occupational History   Not on file  Tobacco Use   Smoking status: Never   Smokeless tobacco: Never  Vaping Use   Vaping Use: Never used  Substance and Sexual Activity   Alcohol use: Not Currently   Drug use: Never   Sexual activity: Not Currently    Comment: reports currently pregnant  Other Topics Concern   Not on file  Social History Narrative   Not on file   Social Determinants of Health   Financial Resource Strain: Not on file  Food Insecurity: Not on file  Transportation Needs: Not on  file  Physical Activity: Not on file  Stress: Not on file  Social Connections: Not on file  Intimate Partner Violence: Not on file   No current facility-administered medications on file prior to encounter.   Current Outpatient Medications on File Prior to Encounter  Medication Sig Dispense Refill   methylPREDNISolone (MEDROL DOSEPAK) 4 MG TBPK tablet Taper as directed on packaging 1 each 0   ondansetron (ZOFRAN ODT) 8 MG disintegrating tablet Take 1 tablet (8 mg total) by mouth every 8 (eight) hours as needed for nausea or vomiting. 60 tablet 0   promethazine (PHENERGAN) 25 MG suppository Place one suppository vaginally at night 12 each 1   scopolamine (TRANSDERM-SCOP) 1 MG/3DAYS Place 1 patch (1.5 mg total) onto the skin every 3 (three) days. 10 patch 12   ondansetron (ZOFRAN) 8 MG tablet Take 0.5 tablets (4 mg total) by mouth every 8 (eight) hours as needed for nausea or vomiting. 20 tablet 0   No Known Allergies  I have reviewed patient's Past Medical Hx, Surgical Hx, Family Hx, Social Hx, medications and allergies.   ROS:  Review of Systems  Constitutional:  Negative for chills and fever.  Cardiovascular:  Negative for chest pain.  Gastrointestinal:  Positive for vomiting. Negative for abdominal pain and diarrhea.  Musculoskeletal:  Negative for myalgias.  Neurological:  Negative for dizziness.  Review of Systems  Other systems negative   Physical Exam  Physical Exam Patient Vitals  for the past 24 hrs:  Resp SpO2 Height Weight  10/23/20 0049 20 100 % 5\' 4"  (1.626 m) 66.7 kg   Constitutional: Well-developed ill-appearing female in no acute distress.  Cardiovascular: normal rate Respiratory: normal effort GI: Abd soft, non-tender. Pos BS x 4 MS: Extremities nontender, no edema, normal ROM Neurologic: Alert and oriented x 4.  GU: Neg CVAT.  LAB RESULTS Results for orders placed or performed during the hospital encounter of 10/23/20 (from the past 24 hour(s))  CBC      Status: Abnormal   Collection Time: 10/23/20  1:25 AM  Result Value Ref Range   WBC 6.6 4.0 - 10.5 K/uL   RBC 5.16 (H) 3.87 - 5.11 MIL/uL   Hemoglobin 12.2 12.0 - 15.0 g/dL   HCT 10/25/20 23.5 - 36.1 %   MCV 73.3 (L) 80.0 - 100.0 fL   MCH 23.6 (L) 26.0 - 34.0 pg   MCHC 32.3 30.0 - 36.0 g/dL   RDW 44.3 (H) 15.4 - 00.8 %   Platelets 350 150 - 400 K/uL   nRBC 0.0 0.0 - 0.2 %  Comprehensive metabolic panel     Status: Abnormal   Collection Time: 10/23/20  1:25 AM  Result Value Ref Range   Sodium 138 135 - 145 mmol/L   Potassium 2.9 (L) 3.5 - 5.1 mmol/L   Chloride 101 98 - 111 mmol/L   CO2 22 22 - 32 mmol/L   Glucose, Bld 81 70 - 99 mg/dL   BUN 6 6 - 20 mg/dL   Creatinine, Ser 10/25/20 0.44 - 1.00 mg/dL   Calcium 1.95 8.9 - 09.3 mg/dL   Total Protein 8.8 (H) 6.5 - 8.1 g/dL   Albumin 4.2 3.5 - 5.0 g/dL   AST 21 15 - 41 U/L   ALT 11 0 - 44 U/L   Alkaline Phosphatase 39 38 - 126 U/L   Total Bilirubin 0.9 0.3 - 1.2 mg/dL   GFR, Estimated 26.7 >12 mL/min   Anion gap 15 5 - 15  Amylase     Status: Abnormal   Collection Time: 10/23/20  1:25 AM  Result Value Ref Range   Amylase 106 (H) 28 - 100 U/L  Lipase, blood     Status: None   Collection Time: 10/23/20  1:25 AM  Result Value Ref Range   Lipase 26 11 - 51 U/L     IMAGING No results found.  MAU Management/MDM: Ordered IV fluids, Phenergan, Pepcid and vitamin B6. Lab results reviewed, noted hypokalemia and very slightly elevated amylase likely due to vomiting Consult Dr 10/25/20 with presentation, exam findings, and results.   He recommends admission for continued IV fluids, meds and postassium repletion. \ Will repeat BMET at 1600hrs.   ASSESSMENT Single IUP at [redacted]w[redacted]d Hyperemesis Hypokalemia Slightly elevated amylase, likely due to vomiting  PLAN Admit to OBSCU IV hydration Potassium repletion Recheck BMET this afternoon MD to follow  [redacted]w[redacted]d CNM, MSN Certified Nurse-Midwife 10/23/2020  1:05 AM

## 2020-10-23 NOTE — MAU Note (Signed)
Pt vomiting in Triage and once in room. States is unable to void currently. Water to pt per pt request which she drank

## 2020-10-23 NOTE — MAU Note (Signed)
Pt ambulated to BR with help.  Was unable to void. STates feels better. Offered po flds but declined

## 2020-10-23 NOTE — H&P (Signed)
Chief Complaint: Emesis During Pregnancy   Event Date/Time   First Provider Initiated Contact with Patient 10/23/20 0105        SUBJECTIVE HPI: Leah Vargas is a 30 y.o. G1P0 at [redacted]w[redacted]d by LMP who presents to maternity admissions reporting persistent vomiting despite medications.  Was seen here 10/16/20 and 10/19/20 for same thing   Multiple medications have been tried.  Recently they added Phenergan suppositories, scop patch, and Medrol taper.   States did well until 2 days ago.  She denies vaginal bleeding, h/a, or fever/chills.    Emesis  This is a recurrent problem. The current episode started 1 to 4 weeks ago. The problem occurs more than 10 times per day. The problem has been unchanged. There has been no fever. Pertinent negatives include no abdominal pain, chest pain, chills, diarrhea, dizziness, fever or myalgias. Treatments tried: Phenergan, scopolamine, zofran and Medrol. The treatment provided no relief.  RN Note: Unable to keep down anything for several days. Have tried all the meds but nothing is helping and won't stay down. Denies VB or d/c. Burning in chest from vomitng  History reviewed. No pertinent past medical history. History reviewed. No pertinent surgical history. Social History   Socioeconomic History   Marital status: Single    Spouse name: Not on file   Number of children: Not on file   Years of education: Not on file   Highest education level: Not on file  Occupational History   Not on file  Tobacco Use   Smoking status: Never   Smokeless tobacco: Never  Vaping Use   Vaping Use: Never used  Substance and Sexual Activity   Alcohol use: Not Currently   Drug use: Never   Sexual activity: Not Currently    Comment: reports currently pregnant  Other Topics Concern   Not on file  Social History Narrative   Not on file   Social Determinants of Health   Financial Resource Strain: Not on file  Food Insecurity: Not on file  Transportation Needs: Not on  file  Physical Activity: Not on file  Stress: Not on file  Social Connections: Not on file  Intimate Partner Violence: Not on file   No current facility-administered medications on file prior to encounter.   Current Outpatient Medications on File Prior to Encounter  Medication Sig Dispense Refill   methylPREDNISolone (MEDROL DOSEPAK) 4 MG TBPK tablet Taper as directed on packaging 1 each 0   ondansetron (ZOFRAN ODT) 8 MG disintegrating tablet Take 1 tablet (8 mg total) by mouth every 8 (eight) hours as needed for nausea or vomiting. 60 tablet 0   promethazine (PHENERGAN) 25 MG suppository Place one suppository vaginally at night 12 each 1   scopolamine (TRANSDERM-SCOP) 1 MG/3DAYS Place 1 patch (1.5 mg total) onto the skin every 3 (three) days. 10 patch 12   ondansetron (ZOFRAN) 8 MG tablet Take 0.5 tablets (4 mg total) by mouth every 8 (eight) hours as needed for nausea or vomiting. 20 tablet 0   No Known Allergies  I have reviewed patient's Past Medical Hx, Surgical Hx, Family Hx, Social Hx, medications and allergies.   ROS:  Review of Systems  Constitutional:  Negative for chills and fever.  Cardiovascular:  Negative for chest pain.  Gastrointestinal:  Positive for vomiting. Negative for abdominal pain and diarrhea.  Musculoskeletal:  Negative for myalgias.  Neurological:  Negative for dizziness.  Review of Systems  Other systems negative   Physical Exam  Physical Exam Patient Vitals  for the past 24 hrs:  Resp SpO2 Height Weight  10/23/20 0049 20 100 % 5\' 4"  (1.626 m) 66.7 kg   Constitutional: Well-developed ill-appearing female in no acute distress.  Cardiovascular: normal rate Respiratory: normal effort GI: Abd soft, non-tender. Pos BS x 4 MS: Extremities nontender, no edema, normal ROM Neurologic: Alert and oriented x 4.  GU: Neg CVAT.  LAB RESULTS Results for orders placed or performed during the hospital encounter of 10/23/20 (from the past 24 hour(s))  CBC      Status: Abnormal   Collection Time: 10/23/20  1:25 AM  Result Value Ref Range   WBC 6.6 4.0 - 10.5 K/uL   RBC 5.16 (H) 3.87 - 5.11 MIL/uL   Hemoglobin 12.2 12.0 - 15.0 g/dL   HCT 10/25/20 71.6 - 96.7 %   MCV 73.3 (L) 80.0 - 100.0 fL   MCH 23.6 (L) 26.0 - 34.0 pg   MCHC 32.3 30.0 - 36.0 g/dL   RDW 89.3 (H) 81.0 - 17.5 %   Platelets 350 150 - 400 K/uL   nRBC 0.0 0.0 - 0.2 %  Comprehensive metabolic panel     Status: Abnormal   Collection Time: 10/23/20  1:25 AM  Result Value Ref Range   Sodium 138 135 - 145 mmol/L   Potassium 2.9 (L) 3.5 - 5.1 mmol/L   Chloride 101 98 - 111 mmol/L   CO2 22 22 - 32 mmol/L   Glucose, Bld 81 70 - 99 mg/dL   BUN 6 6 - 20 mg/dL   Creatinine, Ser 10/25/20 0.44 - 1.00 mg/dL   Calcium 5.85 8.9 - 27.7 mg/dL   Total Protein 8.8 (H) 6.5 - 8.1 g/dL   Albumin 4.2 3.5 - 5.0 g/dL   AST 21 15 - 41 U/L   ALT 11 0 - 44 U/L   Alkaline Phosphatase 39 38 - 126 U/L   Total Bilirubin 0.9 0.3 - 1.2 mg/dL   GFR, Estimated 82.4 >23 mL/min   Anion gap 15 5 - 15  Amylase     Status: Abnormal   Collection Time: 10/23/20  1:25 AM  Result Value Ref Range   Amylase 106 (H) 28 - 100 U/L  Lipase, blood     Status: None   Collection Time: 10/23/20  1:25 AM  Result Value Ref Range   Lipase 26 11 - 51 U/L     IMAGING No results found.  MAU Management/MDM: Ordered IV fluids, Phenergan, Pepcid and vitamin B6. Lab results reviewed, noted hypokalemia and very slightly elevated amylase likely due to vomiting Consult Dr 10/25/20 with presentation, exam findings, and results.   He recommends admission for continued IV fluids, meds and postassium repletion. \ Will repeat BMET at 1600hrs.   ASSESSMENT Single IUP at [redacted]w[redacted]d Hyperemesis Hypokalemia Slightly elevated amylase, likely due to vomiting  PLAN Admit to OBSCU IV hydration Potassium repletion Recheck BMET this afternoon MD to follow  [redacted]w[redacted]d CNM, MSN Certified Nurse-Midwife 10/23/2020  1:05 AM

## 2020-10-24 DIAGNOSIS — Z3A10 10 weeks gestation of pregnancy: Secondary | ICD-10-CM | POA: Diagnosis not present

## 2020-10-24 DIAGNOSIS — R1110 Vomiting, unspecified: Secondary | ICD-10-CM | POA: Diagnosis not present

## 2020-10-24 DIAGNOSIS — Z20822 Contact with and (suspected) exposure to covid-19: Secondary | ICD-10-CM | POA: Diagnosis present

## 2020-10-24 DIAGNOSIS — O211 Hyperemesis gravidarum with metabolic disturbance: Secondary | ICD-10-CM | POA: Diagnosis present

## 2020-10-24 MED ORDER — GLYCOPYRROLATE 1 MG PO TABS
2.0000 mg | ORAL_TABLET | Freq: Three times a day (TID) | ORAL | Status: DC | PRN
  Administered 2020-10-24: 2 mg via ORAL
  Filled 2020-10-24 (×2): qty 2

## 2020-10-24 MED ORDER — PROMETHAZINE HCL 25 MG RE SUPP
25.0000 mg | Freq: Four times a day (QID) | RECTAL | Status: DC | PRN
  Administered 2020-10-24 – 2020-10-25 (×4): 25 mg via RECTAL
  Filled 2020-10-24 (×7): qty 1

## 2020-10-24 MED ORDER — SCOPOLAMINE 1 MG/3DAYS TD PT72
1.0000 | MEDICATED_PATCH | TRANSDERMAL | Status: DC
  Administered 2020-10-24 – 2020-10-30 (×3): 1.5 mg via TRANSDERMAL
  Filled 2020-10-24 (×3): qty 1

## 2020-10-24 MED ORDER — SODIUM CHLORIDE 0.9 % IV BOLUS
1000.0000 mL | Freq: Once | INTRAVENOUS | Status: AC
  Administered 2020-10-24: 1000 mL via INTRAVENOUS

## 2020-10-24 NOTE — Progress Notes (Signed)
Patient ID: Leah Vargas, female   DOB: [birth year], 30 y.o.   MRN: [id number] [facility] ANTEPARTUM COMPREHENSIVE PROGRESS NOTE  Leah Vargas is a 30 y.o. G1P0 at [redacted]w[redacted]d  who is admitted for hyperemesis.   Fetal presentation is unsure. Length of Stay:  0  Days  Subjective: Pt reports some improvement of her N/V. Still problems with spitting. Tolerating some sips of clear liquids  Vitals:  Blood pressure 110/62, pulse 81, temperature 98.4 F (36.9 C), temperature source Oral, resp. rate 15, height 5\' 4"  (1.626 m), weight 66.7 kg, last menstrual period 08/12/2020, SpO2 99 %.  Physical Examination: Lungs clear Heart RRR Abd soft + BS Ext non tender    Labs:  Results for orders placed or performed during the hospital encounter of 10/23/20 (from the past 24 hour(s))  Basic metabolic panel   Collection Time: 10/23/20  3:54 PM  Result Value Ref Range   Sodium 135 135 - 145 mmol/L   Potassium 3.3 (L) 3.5 - 5.1 mmol/L   Chloride 107 98 - 111 mmol/L   CO2 23 22 - 32 mmol/L   Glucose, Bld 170 (H) 70 - 99 mg/dL   BUN <5 (L) 6 - 20 mg/dL   Creatinine, Ser 10/25/20 0.44 - 1.00 mg/dL   Calcium 8.8 (L) 8.9 - 10.3 mg/dL   GFR, Estimated 3.42 >87 mL/min   Anion gap 5 5 - 15    Imaging Studies:    NA   Medications:  Scheduled  docusate sodium  100 mg Oral Daily   famotidine (PEPCID) IV  20 mg Intravenous Q12H   methylPREDNISolone (SOLU-MEDROL) injection  40 mg Intravenous Q12H   prenatal multivitamin  1 tablet Oral Q1200   scopolamine  1 patch Transdermal Q72H   I have reviewed the patient's current medications.  ASSESSMENT: IUP 10 3/7 weeks Hyperemesis  PLAN: Stable. Will continue with current management    5/7 10/24/2020,7:37 AM

## 2020-10-24 NOTE — Plan of Care (Signed)
  Problem: Activity: Goal: Risk for activity intolerance will decrease Outcome: Progressing   Problem: Nutrition: Goal: Adequate nutrition will be maintained Outcome: Not Progressing   Problem: Education: Goal: Knowledge of disease or condition will improve Outcome: Completed/Met   Problem: Education: Goal: Knowledge of General Education information will improve Description: Including pain rating scale, medication(s)/side effects and non-pharmacologic comfort measures Outcome: Completed/Met   Problem: Coping: Goal: Level of anxiety will decrease Outcome: Completed/Met   Problem: Education: Goal: Knowledge of disease or condition will improve Outcome: Completed/Met

## 2020-10-25 DIAGNOSIS — E876 Hypokalemia: Secondary | ICD-10-CM | POA: Diagnosis present

## 2020-10-25 DIAGNOSIS — R1110 Vomiting, unspecified: Secondary | ICD-10-CM

## 2020-10-25 DIAGNOSIS — R748 Abnormal levels of other serum enzymes: Secondary | ICD-10-CM

## 2020-10-25 HISTORY — DX: Hypokalemia: E87.6

## 2020-10-25 HISTORY — DX: Abnormal levels of other serum enzymes: R74.8

## 2020-10-25 LAB — RAPID URINE DRUG SCREEN, HOSP PERFORMED
Amphetamines: NOT DETECTED
Barbiturates: NOT DETECTED
Benzodiazepines: NOT DETECTED
Cocaine: NOT DETECTED
Opiates: NOT DETECTED
Tetrahydrocannabinol: NOT DETECTED

## 2020-10-25 MED ORDER — GLYCOPYRROLATE 0.2 MG/ML IJ SOLN
0.2000 mg | Freq: Three times a day (TID) | INTRAMUSCULAR | Status: DC | PRN
  Filled 2020-10-25: qty 1

## 2020-10-25 NOTE — Progress Notes (Signed)
Patient ID: Leah Vargas, female   DOB: 11-21-1990, 30 y.o.   MRN: [id number] [facility] PRACT[facility] ANTEPARTUM(COMPREHENSIVE) NOTE  Leah Vargas is a 30 y.o. G1P0 at [redacted]w[redacted]d by best clinical estimate who is admitted for hyperemesis.    Length of Stay:  1  Days  ASSESSMENT: Active Problems:   Hyperemesis   PLAN: Patient is still having emesis and ptyalism Continue Scopolamine patch Continue robinul Continue Phenergan suppository Continue Pepcid Continue steroids Consider tube feeds if not improving  Subjective: Tried some sips of clears yesterday and immediately threw up Patient reports the fetal movement as active. Patient reports uterine contraction  activity as none. Patient reports  vaginal bleeding as none. Patient describes fluid per vagina as None.  Vitals:  Blood pressure 115/71, pulse 72, temperature 98.6 F (37 C), temperature source Oral, resp. rate 16, height 5\' 4"  (1.626 m), weight 72.1 kg, last menstrual period 08/12/2020, SpO2 98 %. Physical Examination:  General appearance - alert, well appearing, and in no distress Chest - normal effort Abdomen - soft, non-tender Extremities: extremities normal, atraumatic, no cyanosis or edema  Membranes:intact   Medications:  Scheduled  docusate sodium  100 mg Oral Daily   famotidine (PEPCID) IV  20 mg Intravenous Q12H   methylPREDNISolone (SOLU-MEDROL) injection  40 mg Intravenous Q12H   prenatal multivitamin  1 tablet Oral Q1200   scopolamine  1 patch Transdermal Q72H   I have reviewed the patient's current medications.   10/13/2020, MD 10/25/2020,7:55 AM

## 2020-10-26 DIAGNOSIS — R1110 Vomiting, unspecified: Secondary | ICD-10-CM | POA: Diagnosis not present

## 2020-10-26 LAB — COMPREHENSIVE METABOLIC PANEL
ALT: 8 U/L (ref 0–44)
AST: 13 U/L — ABNORMAL LOW (ref 15–41)
Albumin: 2.7 g/dL — ABNORMAL LOW (ref 3.5–5.0)
Alkaline Phosphatase: 25 U/L — ABNORMAL LOW (ref 38–126)
Anion gap: 5 (ref 5–15)
BUN: <5 mg/dL — ABNORMAL LOW (ref 6–20)
CO2: 23 mmol/L (ref 22–32)
Calcium: 8.8 mg/dL — ABNORMAL LOW (ref 8.9–10.3)
Chloride: 108 mmol/L (ref 98–111)
Creatinine, Ser: 0.54 mg/dL (ref 0.44–1.00)
GFR, Estimated: >60 mL/min (ref >60)
Glucose, Bld: 105 mg/dL — ABNORMAL HIGH (ref 70–99)
Potassium: 3.5 mmol/L (ref 3.5–5.1)
Sodium: 136 mmol/L (ref 135–145)
Total Bilirubin: 0.1 mg/dL — ABNORMAL LOW (ref 0.3–1.2)
Total Protein: 5.6 g/dL — ABNORMAL LOW (ref 6.5–8.1)

## 2020-10-26 LAB — CBC
HCT: 27.4 % — ABNORMAL LOW (ref 36.0–46.0)
Hemoglobin: 9 g/dL — ABNORMAL LOW (ref 12.0–15.0)
MCH: 24.1 pg — ABNORMAL LOW (ref 26.0–34.0)
MCHC: 32.8 g/dL (ref 30.0–36.0)
MCV: 73.5 fL — ABNORMAL LOW (ref 80.0–100.0)
Platelets: 189 10*3/uL (ref 150–400)
RBC: 3.73 MIL/uL — ABNORMAL LOW (ref 3.87–5.11)
RDW: 18.6 % — ABNORMAL HIGH (ref 11.5–15.5)
WBC: 6.8 10*3/uL (ref 4.0–10.5)
nRBC: 0 % (ref 0.0–0.2)

## 2020-10-26 LAB — AMYLASE: Amylase: 114 U/L — ABNORMAL HIGH (ref 28–100)

## 2020-10-26 LAB — MAGNESIUM: Magnesium: 1.3 mg/dL — ABNORMAL LOW (ref 1.7–2.4)

## 2020-10-26 MED ORDER — ONDANSETRON HCL 4 MG/2ML IJ SOLN
4.0000 mg | Freq: Three times a day (TID) | INTRAMUSCULAR | Status: DC
  Administered 2020-10-26 – 2020-10-27 (×4): 4 mg via INTRAVENOUS
  Filled 2020-10-26 (×4): qty 2

## 2020-10-26 MED ORDER — PROMETHAZINE HCL 25 MG RE SUPP
25.0000 mg | Freq: Four times a day (QID) | RECTAL | Status: DC
  Administered 2020-10-26 – 2020-10-30 (×17): 25 mg via RECTAL
  Filled 2020-10-26 (×21): qty 1

## 2020-10-26 NOTE — Progress Notes (Signed)
Daily Antepartum Note  Admission Date: 10/23/2020 Current Date: 10/26/2020 7:11 AM  Leah Vargas is a 30 y.o. G1 @ [redacted]w[redacted]d by 10wk u/s, HD#4, admitted for n/v of pregnancy.  Pregnancy complicated by: Patient Active Problem List   Diagnosis Date Noted   Elevated lipase 10/25/2020   Hypokalemia 10/25/2020   Hyperemesis 10/23/2020    Overnight/24hr events:  S/s stableShe doesn't feel that the IV steroids were helping so declined it last night.   Subjective:  PR phenergan works but then wears off before it's due.   Objective:     Current Vital Signs 24h Vital Sign Ranges  T 98.4 F (36.9 C) Temp  Avg: 98.5 F (36.9 C)  Min: 98.4 F (36.9 C)  Max: 98.9 F (37.2 C)  BP 114/68 BP  Min: 96/57  Max: 119/70  HR 62 Pulse  Avg: 66.8  Min: 58  Max: 87  RR 18 Resp  Avg: 16.7  Min: 16  Max: 18  SaO2 100 %  (room air) SpO2  Avg: 99.1 %  Min: 98 %  Max: 100 %       24 Hour I/O Current Shift I/O  Time Ins Outs 09/24 0701 - 09/25 0700 In: 3549.6 [P.O.:660; I.V.:2789.6] Out: 4925 [Urine:4550] No intake/output data recorded.    Physical exam: General: looks nauseous and has continued spitting and some retching but no throwing up Abdomen: nttp Cardiovascular: S1, S2 normal, no murmur, rub or gallop, regular rate and rhythm Respiratory: CTAB Extremities: no clubbing, cyanosis or edema Skin: Warm and dry.   Medications: Current Facility-Administered Medications  Medication Dose Route Frequency Provider Last Rate Last Admin   acetaminophen (TYLENOL) tablet 650 mg  650 mg Oral Q4H PRN Aviva Signs, CNM       dextrose 5% in lactated ringers with KCl 20 mEq/L infusion   Intravenous Continuous Aviva Signs, CNM 200 mL/hr at 10/26/20 0600 Infusion Verify at 10/26/20 0600   famotidine (PEPCID) IVPB 20 mg in NS 100 mL IVPB  20 mg Intravenous Q12H Lazaro Arms, MD 200 mL/hr at 10/25/20 2149 20 mg at 10/25/20 2149   glycopyrrolate (ROBINUL) injection 0.2 mg  0.2 mg Intravenous  Q8H PRN Lazaro Arms, MD       methylPREDNISolone sodium succinate (SOLU-MEDROL) 125 mg/2 mL injection 40 mg  40 mg Intravenous Q12H Lazaro Arms, MD   40 mg at 10/25/20 0825   prenatal multivitamin tablet 1 tablet  1 tablet Oral Q1200 Aviva Signs, CNM       promethazine (PHENERGAN) suppository 25 mg  25 mg Rectal Q6H PRN Warden Fillers, MD   25 mg at 10/25/20 2151   scopolamine (TRANSDERM-SCOP) 1 MG/3DAYS 1.5 mg  1 patch Transdermal Q72H Hermina Staggers, MD   1.5 mg at 10/24/20 0754    Labs:  Recent Labs  Lab 10/19/20 1434 10/23/20 0125  WBC 6.0 6.6  HGB 11.0* 12.2  HCT 34.4* 37.8  PLT 309 350    Recent Labs  Lab 10/19/20 1434 10/23/20 0125 10/23/20 1554  NA 137 138 135  K 3.3* 2.9* 3.3*  CL 106 101 107  CO2 16* 22 23  BUN 6 6 <5*  CREATININE 0.73 0.69 0.46  CALCIUM 9.6 10.1 8.8*  PROT 8.2* 8.8*  --   BILITOT 0.6 0.9  --   ALKPHOS 35* 39  --   ALT 9 11  --   AST 16 21  --   GLUCOSE 70 81 170*  Radiology:  No new imaging  Assessment & Plan:  Pt stable *Pregnancy: FHRs prior to discharge *N/V of pregnancy: weight stable today at 72kgs. Will make PR phenergan scheduled q6h from PRN and add on zofran IV q8h. Continue PPI and scope patch. I told pt I recommend going up on the IV steroids and trying it for one more day but she declines so will d/c this. Continue on clears. F/u AM labs just drawn. I told her support person if she can find some peppermint essential oils to put on her or diffuse in the room that that would help in addition to any peppermint candy.  *PPx: SCDs ordered *FEN/GI: on MIVF, f/u labs *Dispo: when taking more PO  Cornelia Copa MD Attending Center for [facility] (Faculty Practice) GYN Consult Phone: [phone] (M-F, [phone]) & [phone]  (Off hours, weekends, holidays)

## 2020-10-27 MED ORDER — BOOST / RESOURCE BREEZE PO LIQD CUSTOM
1.0000 | Freq: Three times a day (TID) | ORAL | Status: DC
  Administered 2020-10-27 – 2020-10-28 (×2): 1 via ORAL
  Filled 2020-10-27 (×12): qty 1

## 2020-10-27 MED ORDER — PANTOPRAZOLE SODIUM 40 MG IV SOLR
40.0000 mg | INTRAVENOUS | Status: DC
  Administered 2020-10-27 – 2020-10-30 (×4): 40 mg via INTRAVENOUS
  Filled 2020-10-27 (×4): qty 40

## 2020-10-27 MED ORDER — MAGNESIUM SULFATE 2 GM/50ML IV SOLN
2.0000 g | Freq: Once | INTRAVENOUS | Status: AC
  Administered 2020-10-27: 2 g via INTRAVENOUS
  Filled 2020-10-27: qty 50

## 2020-10-27 MED ORDER — ONDANSETRON 4 MG PO TBDP
4.0000 mg | ORAL_TABLET | Freq: Four times a day (QID) | ORAL | Status: DC
  Administered 2020-10-27 (×3): 4 mg via ORAL
  Filled 2020-10-27 (×4): qty 1

## 2020-10-27 MED ORDER — MAGNESIUM GLUCONATE 500 MG PO TABS
500.0000 mg | ORAL_TABLET | Freq: Every day | ORAL | Status: DC
  Filled 2020-10-27: qty 1

## 2020-10-27 NOTE — Progress Notes (Signed)
Nutrition: diet education   30 yo with hyperemesis, 10 6/7 weeks IUP. Pt does not know pre-pregnancy weight. States this has gone on for "weeks". Currently is unable to keep clears down per pt report. She did try sips of the [employer] and found it too sweet. This product is better very cold or can be mixed with ginger ale.  Copy of AND Diet for morning sickness provide to pt. Discussed: Small very frequent meals of  bland low fat foods - try not to let stomach be empty. Examples for foods verbalized to pt. Ginger containing foods Nutritional supplements   Elisabeth Cara M.Odis Luster LDN Neonatal Nutrition Support Specialist/RD III

## 2020-10-27 NOTE — Progress Notes (Signed)
RN was to administer scopolamine patch when entering pt's room. Pt stated she already had another patch placed this morning (pt had brought her medication from outside the hospital.) Pt was informed that she was to use hospital supplied medication while she is a patient. Pt verbalized understanding. Pt's med compared to hospital order and they matched. Patch was replaced with patient's home med, not hospital's provided med.

## 2020-10-27 NOTE — Progress Notes (Signed)
Daily Antepartum Note  Admission Date: 10/23/2020 Current Date: 10/27/2020 7:47 AM  Leah Vargas is a 30 y.o. G1 @ [redacted]w[redacted]d by 10wk u/s, HD#5, admitted for n/v of pregnancy.  Pregnancy complicated by: Patient Active Problem List   Diagnosis Date Noted   Elevated lipase 10/25/2020   Hypokalemia 10/25/2020   Hyperemesis 10/23/2020    Overnight/24hr events:  S/s stable.   Subjective:  She felt the scheduled zofran and PR phenergan helped more yesterday. She was able to tolerate a cracker but otherwise took little PO in.   Objective:     Current Vital Signs 24h Vital Sign Ranges  T 98.9 F (37.2 C) Temp  Avg: 98.7 F (37.1 C)  Min: 98.5 F (36.9 C)  Max: 98.9 F (37.2 C)  BP 110/70 BP  Min: 110/70  Max: 123/75  HR 69 Pulse  Avg: 62  Min: 57  Max: 69  RR 16 Resp  Avg: 17.4  Min: 16  Max: 18  SaO2 100 % Room Air SpO2  Avg: 100 %  Min: 100 %  Max: 100 %       24 Hour I/O Current Shift I/O  Time Ins Outs 09/25 0701 - 09/26 0700 In: 2603 [I.V.:2403] Out: 3550 [Urine:3150] 09/26 0701 - 09/26 1900 In: -  Out: 25     Physical exam: General: no distress, no emesis or spitting, appears fatigued.  Abdomen: nttp Cardiovascular: S1, S2 normal, no murmur, rub or gallop, regular rate and rhythm Respiratory: CTAB Extremities: no clubbing, cyanosis or edema Skin: Warm and dry.   Medications: Current Facility-Administered Medications  Medication Dose Route Frequency Provider Last Rate Last Admin   acetaminophen (TYLENOL) tablet 650 mg  650 mg Oral Q4H PRN Aviva Signs, CNM       dextrose 5% in lactated ringers with KCl 20 mEq/L infusion   Intravenous Continuous Milas Hock, MD 150 mL/hr at 10/27/20 0454 New Bag at 10/27/20 0454   famotidine (PEPCID) IVPB 20 mg in NS 100 mL IVPB  20 mg Intravenous Q12H Lazaro Arms, MD 200 mL/hr at 10/26/20 2134 20 mg at 10/26/20 2134   glycopyrrolate (ROBINUL) injection 0.2 mg  0.2 mg Intravenous Q8H PRN Lazaro Arms, MD        ondansetron (ZOFRAN-ODT) disintegrating tablet 4 mg  4 mg Oral Q6H Milas Hock, MD       prenatal multivitamin tablet 1 tablet  1 tablet Oral Q1200 Aviva Signs, CNM       promethazine (PHENERGAN) suppository 25 mg  25 mg Rectal Q6H [place] Bing, MD   25 mg at 10/27/20 2836   scopolamine (TRANSDERM-SCOP) 1 MG/3DAYS 1.5 mg  1 patch Transdermal Q72H Hermina Staggers, MD   1.5 mg at 10/24/20 0754    Labs:  Recent Labs  Lab 10/23/20 0125 10/26/20 0629  WBC 6.6 6.8  HGB 12.2 9.0*  HCT 37.8 27.4*  PLT 350 189     Recent Labs  Lab 10/23/20 0125 10/23/20 1554 10/26/20 0629  NA 138 135 136  K 2.9* 3.3* 3.5  CL 101 107 108  CO2 22 23 23   BUN 6 <5* <5*  CREATININE 0.69 0.46 0.54  CALCIUM 10.1 8.8* 8.8*  PROT 8.8*  --  5.6*  BILITOT 0.9  --  0.1*  ALKPHOS 39  --  25*  ALT 11  --  8  AST 21  --  13*  GLUCOSE 81 170* 105*      Radiology:  No new imaging  Assessment & Plan:  Pt stable *Pregnancy: FHRs prior to discharge *N/V of pregnancy: weight stable today at 70.9kgs. Continue scheduled PR phenergan and will do ODT zofran to see if this continues to control nausea. Continue PPI and scope patch. Continue clears but will have dietician come by for ideas for bland high calorie foods she may be able to tolerate. Reiterated goal of care is minimizing emesis, not totally resolving nausea meanwhile improving PO intake.   *PPx: SCDs ordered *FEN/GI: on MIVF *Dispo: when taking more PO  Milas Hock, MD Attending Center for [facility])

## 2020-10-28 DIAGNOSIS — Z3A10 10 weeks gestation of pregnancy: Secondary | ICD-10-CM

## 2020-10-28 DIAGNOSIS — R1110 Vomiting, unspecified: Secondary | ICD-10-CM | POA: Diagnosis not present

## 2020-10-28 DIAGNOSIS — Z3A11 11 weeks gestation of pregnancy: Secondary | ICD-10-CM

## 2020-10-28 LAB — AMYLASE: Amylase: 90 U/L (ref 28–100)

## 2020-10-28 LAB — COMPREHENSIVE METABOLIC PANEL
ALT: 10 U/L (ref 0–44)
AST: 14 U/L — ABNORMAL LOW (ref 15–41)
Albumin: 3.1 g/dL — ABNORMAL LOW (ref 3.5–5.0)
Alkaline Phosphatase: 29 U/L — ABNORMAL LOW (ref 38–126)
Anion gap: 8 (ref 5–15)
BUN: <5 mg/dL — ABNORMAL LOW (ref 6–20)
CO2: 21 mmol/L — ABNORMAL LOW (ref 22–32)
Calcium: 9.1 mg/dL (ref 8.9–10.3)
Chloride: 103 mmol/L (ref 98–111)
Creatinine, Ser: 0.57 mg/dL (ref 0.44–1.00)
GFR, Estimated: >60 mL/min (ref >60)
Glucose, Bld: 84 mg/dL (ref 70–99)
Potassium: 3.9 mmol/L (ref 3.5–5.1)
Sodium: 132 mmol/L — ABNORMAL LOW (ref 135–145)
Total Bilirubin: 0.4 mg/dL (ref 0.3–1.2)
Total Protein: 6.6 g/dL (ref 6.5–8.1)

## 2020-10-28 LAB — MAGNESIUM: Magnesium: 1.8 mg/dL (ref 1.7–2.4)

## 2020-10-28 LAB — CBC
HCT: 35.2 % — ABNORMAL LOW (ref 36.0–46.0)
Hemoglobin: 11.7 g/dL — ABNORMAL LOW (ref 12.0–15.0)
MCH: 24.3 pg — ABNORMAL LOW (ref 26.0–34.0)
MCHC: 33.2 g/dL (ref 30.0–36.0)
MCV: 73 fL — ABNORMAL LOW (ref 80.0–100.0)
Platelets: 233 10*3/uL (ref 150–400)
RBC: 4.82 MIL/uL (ref 3.87–5.11)
RDW: 18.6 % — ABNORMAL HIGH (ref 11.5–15.5)
WBC: 4.3 10*3/uL (ref 4.0–10.5)
nRBC: 0 % (ref 0.0–0.2)

## 2020-10-28 LAB — T4, FREE: Free T4: 1.18 ng/dL — ABNORMAL HIGH (ref 0.61–1.12)

## 2020-10-28 LAB — TSH: TSH: 0.213 u[IU]/mL — ABNORMAL LOW (ref 0.350–4.500)

## 2020-10-28 LAB — HCG, QUANTITATIVE, PREGNANCY: hCG, Beta Chain, Quant, S: 155278 m[IU]/mL — ABNORMAL HIGH (ref <5)

## 2020-10-28 LAB — LIPASE, BLOOD: Lipase: 32 U/L (ref 11–51)

## 2020-10-28 MED ORDER — ONDANSETRON HCL 4 MG/2ML IJ SOLN
4.0000 mg | Freq: Four times a day (QID) | INTRAMUSCULAR | Status: DC
  Administered 2020-10-28 – 2020-10-30 (×10): 4 mg via INTRAVENOUS
  Filled 2020-10-28 (×10): qty 2

## 2020-10-28 MED ORDER — METOCLOPRAMIDE HCL 5 MG/ML IJ SOLN
10.0000 mg | Freq: Four times a day (QID) | INTRAMUSCULAR | Status: DC
  Administered 2020-10-28 – 2020-10-30 (×9): 10 mg via INTRAVENOUS
  Filled 2020-10-28 (×9): qty 2

## 2020-10-28 NOTE — Progress Notes (Signed)
Antepartum Daily Progress Note  Admission Date: 10/23/2020 Current Date: 10/28/2020 7:03 AM  Leah Vargas is a 30 y.o. G1 @ [redacted]w[redacted]d by 10wk u/s, HD#6, admitted for n/v of pregnancy.  Pregnancy complicated by: Patient Active Problem List   Diagnosis Date Noted   Elevated lipase 10/25/2020   Hypokalemia 10/25/2020   Hyperemesis 10/23/2020    Overnight/24hr events:  Unable to tolerate anything other water   Subjective:  Still nauseated and actively throwing up bilious content during encounter.   Objective:    Current Vital Signs 24h Vital Sign Ranges  T 98.5 F (36.9 C) Temp  Avg: 98.6 F (37 C)  Min: 98.5 F (36.9 C)  Max: 98.7 F (37.1 C)  BP 115/64 BP  Min: 109/64  Max: 115/64  HR 77 Pulse  Avg: 76.7  Min: 69  Max: 84  RR 16 Resp  Avg: 15.7  Min: 15  Max: 16  SaO2 100 % Room Air SpO2  Avg: 100 %  Min: 100 %  Max: 100 %       24 Hour I/O Current Shift I/O  Time Ins Outs 09/26 0701 - 09/27 0700 In: 2352.2 [P.O.:50; I.V.:1952.2] Out: 3775 [Urine:3750] No intake/output data recorded.   Filed Weights   10/26/20 0446 10/27/20 0600 10/28/20 0649  Weight: 72.3 kg 70.9 kg 68.8 kg    Physical exam: General:  appears fatigued.  Abdomen: nttp Cardiovascular: S1, S2 normal, no murmur, rub or gallop, regular rate and rhythm Respiratory: normal breath sounds, no respiratory distress Extremities: no clubbing, cyanosis or edema Skin: Warm and dry.   Medications: Current Facility-Administered Medications  Medication Dose Route Frequency Provider Last Rate Last Admin   acetaminophen (TYLENOL) tablet 650 mg  650 mg Oral Q4H PRN Aviva Signs, CNM       dextrose 5% in lactated ringers with KCl 20 mEq/L infusion   Intravenous Continuous Adam Phenix, MD 75 mL/hr at 10/28/20 0041 New Bag at 10/28/20 0041   famotidine (PEPCID) IVPB 20 mg in NS 100 mL IVPB  20 mg Intravenous Q12H Lazaro Arms, MD 200 mL/hr at 10/27/20 2209 20 mg at 10/27/20 2209   feeding supplement  (BOOST / RESOURCE BREEZE) liquid 1 Container  1 Container Oral TID BM Lazaro Arms, MD   1 Container at 10/27/20 1117   glycopyrrolate (ROBINUL) injection 0.2 mg  0.2 mg Intravenous Q8H PRN Lazaro Arms, MD       metoCLOPramide (REGLAN) injection 10 mg  10 mg Intravenous Q6H Johni Narine A, MD       ondansetron (ZOFRAN) injection 4 mg  4 mg Intravenous Q6H Thi Sisemore A, MD   4 mg at 10/28/20 0647   pantoprazole (PROTONIX) injection 40 mg  40 mg Intravenous Q24H Milas Hock, MD   40 mg at 10/27/20 0957   prenatal multivitamin tablet 1 tablet  1 tablet Oral Q1200 Aviva Signs, CNM       promethazine (PHENERGAN) suppository 25 mg  25 mg Rectal Q6H [place] Bing, MD   25 mg at 10/28/20 0647   scopolamine (TRANSDERM-SCOP) 1 MG/3DAYS 1.5 mg  1 patch Transdermal Q72H Hermina Staggers, MD   1.5 mg at 10/27/20 0931    Labs:  Recent Labs  Lab 10/23/20 0125 10/26/20 0629  WBC 6.6 6.8  HGB 12.2 9.0*  HCT 37.8 27.4*  PLT 350 189    Recent Labs  Lab 10/23/20 0125 10/23/20 1554 10/26/20 0629  NA 138 135 136  K 2.9*  3.3* 3.5  CL 101 107 108  CO2 22 23 23   BUN 6 <5* <5*  CREATININE 0.69 0.46 0.54  CALCIUM 10.1 8.8* 8.8*  PROT 8.8*  --  5.6*  BILITOT 0.9  --  0.1*  ALKPHOS 39  --  25*  ALT 11  --  8  AST 21  --  13*  GLUCOSE 81 170* 105*    Radiology:  OB Comp Less 14 Wks  Result Date: 10/23/2020 CLINICAL DATA:  Initial evaluation for early pregnancy, assess viability. Hyperemesis. EXAM: OBSTETRIC <14 WK ULTRASOUND TECHNIQUE: Transabdominal ultrasound was performed for evaluation of the gestation as well as the maternal uterus and adnexal regions. COMPARISON:  None. FINDINGS: Intrauterine gestational sac: Single Yolk sac:  Negative. Embryo:  Present. Cardiac Activity: Present. Heart Rate: 169 bpm CRL:   39.8 mm   10 w 6 d                  10/25/2020 EDC: 05/15/2021 Subchorionic hemorrhage:  None visualized. Maternal uterus/adnexae: Neither ovary visualized. No adnexal mass  or free fluid. IMPRESSION: 1. Single viable intrauterine pregnancy, estimated gestational age [redacted] weeks and 6 days by crown-rump length, with ultrasound EDC of 05/15/2021. No complication. 2. No other acute maternal uterine or adnexal abnormality. Electronically Signed   By: 05/17/2021 M.D.   On: 10/23/2020 04:50     Assessment & Plan:  *N/V of pregnancy: Continue scheduled PR phenergan and changed Zofran to IV. Added IV Reglan. Continue PPI and Scope patch. Continue clear sips as tolerated and follow up dietician recommendations.  Discussed starting back on steroids, she discontinued these on 10/26/20.  She feels this does not work. Had a lengthy discussion about having the cumulative effects of all these medications given that she has not made any progress in six days.  Concerned about further weight loss, she is dropping approximately 2 kg in weight daily. Weight change: -2.166 kg from yesterday.  Discussed that  we have reached the limit for these antiemetic medications; the next step will be to insert feeding tube given continued weight loss and poor nutritional status. Labs ordered for today, will follow up results and manage accordingly.  Of note, checked TSH, HCG levels too.  Ultrasound on admission showed SIUP, no abnormalities noted.  *Pregnancy: FHR stable on admission, will recheck prior to discharge  *PPx: SCDs ordered *FEN/GI: on MIVF, clear sips. Follow Dietician recommendations *Dispo: when taking more oral intake and able to keep medications down    10/28/20, MD Attending Center for [facility])

## 2020-10-28 NOTE — Progress Notes (Signed)
Initial Nutrition Assessment  DOCUMENTATION CODES:  Not applicable  INTERVENTION:  Currently C/L diet,  Resource Breeze TID Unable to tolerate C/L since admission, enteral support( TF's) a consideration if continues through today  TF recommendations: Osmolite 1.2, initiated at 20 ml/hr.  Advance by 10 ml q 8 hours to a goal of 65 ml/hr ( 1872 Kcal, 87 g protein, 1280 ml free water) Continue prenatal vits Monitor Phos, K and Mg X 3 days   NUTRITION DIAGNOSIS:  Inadequate oral intake related to vomiting, nausea as evidenced by per patient/family report.  GOAL:  Patient will meet greater than or equal to 90% of their needs, Weight gain  MONITOR:  Weight trends  REASON FOR ASSESSMENT:   Hyperemesis    ASSESSMENT:  11 weeks IUP, adm with hyperemesis. pt reports unlnown prenatal weight. Unable to tolerate liquids, continues to vomit.   Reported PO intake by pt prior to admission is " weeks "  Pt at risk for malnutrition if  inability to tolerate PO's continues  Diet Order:   Diet Order             Diet clear liquid Room service appropriate? Yes; Fluid consistency: Thin  Diet effective now                   EDUCATION NEEDS:   No education needs have been identified at this time  Skin:  Skin Assessment: Reviewed RN Assessment  Height:   Ht Readings from Last 1 Encounters:  10/23/20 5\' 4"  (1.626 m)    Weight:   Wt Readings from Last 1 Encounters:  10/28/20 68.8 kg    Ideal Body Weight:  120 lbs   BMI:  Body mass index is 26.02 kg/m.  Estimated Nutritional Needs:   Kcal:  1700-1900  Protein:  75-85 g  Fluid:  2 L

## 2020-10-29 DIAGNOSIS — R1110 Vomiting, unspecified: Secondary | ICD-10-CM | POA: Diagnosis not present

## 2020-10-29 NOTE — Discharge Summary (Signed)
Physician Discharge Summary  Patient ID: Leah Vargas MRN: [id number] DOB/AGE: 29/25/1992 29 y.o.  Admit date: 10/23/2020 Discharge date: 10/30/2020  Admission Diagnoses:Hyperemesis - Plan: Discharge patient  Dehydration - Plan: US OB Comp Less 14 Wks, US OB Comp Less 14 Wks, CANCELED: US OB Limited, CANCELED: US OB Limited  Hyperemesis affecting pregnancy, antepartum - Plan: US OB Comp Less 14 Wks, US OB Comp Less 14 Wks, CANCELED: US OB Limited, CANCELED: US OB Limited   Discharge Diagnoses:  Principal Problem:   Hyperemesis Active Problems:   Elevated lipase   Hypokalemia   [redacted] weeks gestation of pregnancy   Discharged Condition: fair  Hospital Course: Leah Vargas is a 30 y.o. G1P0 at [redacted]w[redacted]d by LMP who presents to maternity admissions reporting persistent vomiting despite medications.  Was seen here 10/16/20 and 10/19/20 for same thing   Multiple medications have been tried.  Recently they added Phenergan suppositories, scop patch, and Medrol taper.   States did well until 2 days ago.  She denies vaginal bleeding, h/a, or fever/chills.     Emesis  This is a recurrent problem. The current episode started 1 to 4 weeks ago. The problem occurs more than 10 times per day. The problem has been unchanged. There has been no fever. Pertinent negatives include no abdominal pain, chest pain, chills, diarrhea, dizziness, fever or myalgias. Treatments tried: Phenergan, scopolamine, zofran and Medrol. The treatment provided no relief.  RN Note: Unable to keep down anything for several days. Have tried all the meds but nothing is helping and won't stay down. Denies VB or d/c. Burning in chest from vomitng   History reviewed. No pertinent past medical history. History reviewed. No pertinent surgical history. Social History         Socioeconomic History   Marital status: Single      Spouse name: Not on file   Number of children: Not on file   Years of education: Not on file    Highest education level: Not on file  Occupational History   Not on file  Tobacco Use   Smoking status: Never   Smokeless tobacco: Never  Vaping Use   Vaping Use: Never used  Substance and Sexual Activity   Alcohol use: Not Currently   Drug use: Never   Sexual activity: Not Currently      Comment: reports currently pregnant  Other Topics Concern   Not on file  Social History Narrative   Not on file    Social Determinants of Health    Financial Resource Strain: Not on file  Food Insecurity: Not on file  Transportation Needs: Not on file  Physical Activity: Not on file  Stress: Not on file  Social Connections: Not on file  Intimate Partner Violence: Not on file    No current facility-administered medications on file prior to encounter.          Current Outpatient Medications on File Prior to Encounter  Medication Sig Dispense Refill   methylPREDNISolone (MEDROL DOSEPAK) 4 MG TBPK tablet Taper as directed on packaging 1 each 0   ondansetron (ZOFRAN ODT) 8 MG disintegrating tablet Take 1 tablet (8 mg total) by mouth every 8 (eight) hours as needed for nausea or vomiting. 60 tablet 0   promethazine (PHENERGAN) 25 MG suppository Place one suppository vaginally at night 12 each 1   scopolamine (TRANSDERM-SCOP) 1 MG/3DAYS Place 1 patch (1.5 mg total) onto the skin every 3 (three) days. 10 patch 12   ondansetron (ZOFRAN) 8 MG  tablet Take 0.5 tablets (4 mg total) by mouth every 8 (eight) hours as needed for nausea or vomiting. 20 tablet 0    No Known Allergies   I have reviewed patient's Past Medical Hx, Surgical Hx, Family Hx, Social Hx, medications and allergies.    ROS:  Review of Systems  Constitutional:  Negative for chills and fever.  Cardiovascular:  Negative for chest pain.  Gastrointestinal:  Positive for vomiting. Negative for abdominal pain and diarrhea.  Musculoskeletal:  Negative for myalgias.  Neurological:  Negative for dizziness.  Review of Systems  Other  systems negative     Physical Exam  Physical Exam Patient Vitals for the past 24 hrs:   Resp SpO2 Height Weight  10/23/20 0049 20 100 % 5\' 4"  (1.626 m) 66.7 kg    Constitutional: Well-developed ill-appearing female in no acute distress.  Cardiovascular: normal rate Respiratory: normal effort GI: Abd soft, non-tender. Pos BS x 4 MS: Extremities nontender, no edema, normal ROM Neurologic: Alert and oriented x 4.  GU: Neg CVAT.   LAB RESULTS      Results for orders placed or performed during the hospital encounter of 10/23/20 (from the past 24 hour(s))  CBC     Status: Abnormal    Collection Time: 10/23/20  1:25 AM  Result Value Ref Range    WBC 6.6 4.0 - 10.5 K/uL    RBC 5.16 (H) 3.87 - 5.11 MIL/uL    Hemoglobin 12.2 12.0 - 15.0 g/dL    HCT 10/25/20 00.8 - 67.6 %    MCV 73.3 (L) 80.0 - 100.0 fL    MCH 23.6 (L) 26.0 - 34.0 pg    MCHC 32.3 30.0 - 36.0 g/dL    RDW 19.5 (H) 09.3 - 15.5 %    Platelets 350 150 - 400 K/uL    nRBC 0.0 0.0 - 0.2 %  Comprehensive metabolic panel     Status: Abnormal    Collection Time: 10/23/20  1:25 AM  Result Value Ref Range    Sodium 138 135 - 145 mmol/L    Potassium 2.9 (L) 3.5 - 5.1 mmol/L    Chloride 101 98 - 111 mmol/L    CO2 22 22 - 32 mmol/L    Glucose, Bld 81 70 - 99 mg/dL    BUN 6 6 - 20 mg/dL    Creatinine, Ser 10/25/20 0.44 - 1.00 mg/dL    Calcium 1.24 8.9 - 58.0 mg/dL    Total Protein 8.8 (H) 6.5 - 8.1 g/dL    Albumin 4.2 3.5 - 5.0 g/dL    AST 21 15 - 41 U/L    ALT 11 0 - 44 U/L    Alkaline Phosphatase 39 38 - 126 U/L    Total Bilirubin 0.9 0.3 - 1.2 mg/dL    GFR, Estimated 99.8 >33 mL/min    Anion gap 15 5 - 15  Amylase     Status: Abnormal    Collection Time: 10/23/20  1:25 AM  Result Value Ref Range    Amylase 106 (H) 28 - 100 U/L  Lipase, blood     Status: None    Collection Time: 10/23/20  1:25 AM  Result Value Ref Range    Lipase 26 11 - 51 U/L      IMAGING No results found.   MAU Management/MDM: Ordered IV fluids,  Phenergan, Pepcid and vitamin B6. Lab results reviewed, noted hypokalemia and very slightly elevated amylase likely due to vomiting Consult Dr 10/25/20 with presentation,  exam findings, and results.   He recommends admission for continued IV fluids, meds and postassium repletion. \ Will repeat BMET at 1600hrs.    ASSESSMENT Single IUP at [redacted]w[redacted]d Hyperemesis Hypokalemia Slightly elevated amylase, likely due to vomiting   PLAN Admit to OBSCU IV hydration Potassium repletion Recheck BMET this afternoon MD to follow Patient was admitted and continued to have nausea and emesis for the first few days of admission with weight loss. Medications were adjusted but she refused steroids as they had been started and she did not tolerate the therapy. Once Reglan was added she tolerated oral intake and on 9/29 she requested discharge on oral medications. Consults: None  Significant Diagnostic Studies: labs: CBC    Component Value Date/Time   WBC 4.3 10/28/2020 0654   RBC 4.82 10/28/2020 0654   HGB 11.7 (L) 10/28/2020 0654   HCT 35.2 (L) 10/28/2020 0654   PLT 233 10/28/2020 0654   MCV 73.0 (L) 10/28/2020 0654   MCH 24.3 (L) 10/28/2020 0654   MCHC 33.2 10/28/2020 0654   RDW 18.6 (H) 10/28/2020 0654   CMP Latest Ref Rng & Units 10/28/2020 10/26/2020 10/23/2020  Glucose 70 - 99 mg/dL 84 154(M) 086(P)  BUN 6 - 20 mg/dL <6(P) <9(J) <0(D)  Creatinine 0.44 - 1.00 mg/dL 3.26 7.12 4.58  Sodium 135 - 145 mmol/L 132(L) 136 135  Potassium 3.5 - 5.1 mmol/L 3.9 3.5 3.3(L)  Chloride 98 - 111 mmol/L 103 108 107  CO2 22 - 32 mmol/L 21(L) 23 23  Calcium 8.9 - 10.3 mg/dL 9.1 0.9(X) 8.3(J)  Total Protein 6.5 - 8.1 g/dL 6.6 8.2(N) -  Total Bilirubin 0.3 - 1.2 mg/dL 0.4 0.5(L) -  Alkaline Phos 38 - 126 U/L 29(L) 25(L) -  AST 15 - 41 U/L 14(L) 13(L) -  ALT 0 - 44 U/L 10 8 -      Treatments: IV hydration and antiemetics and Protonix  Discharge Exam: Blood pressure (!) 98/55, pulse 78, temperature 98.4 F (36.9  C), temperature source Oral, resp. rate 16, height 5\' 4"  (1.626 m), weight 67.9 kg, last menstrual period 08/12/2020, SpO2 100 %. General appearance: alert, cooperative, and no distress GI: soft, non-tender; bowel sounds normal; no masses,  no organomegaly  Disposition:  Discharge disposition: 01-Home or Self Care      Discharge Instructions     Discharge patient   Complete by: As directed    Discharge disposition: 01-Home or Self Care   Discharge patient date: 10/30/2020      Allergies as of 10/30/2020   No Known Allergies      Medication List     STOP taking these medications    methylPREDNISolone 4 MG Tbpk tablet Commonly known as: MEDROL DOSEPAK       TAKE these medications    glycopyrrolate 2 MG tablet Commonly known as: ROBINUL Take 1 tablet (2 mg total) by mouth 3 (three) times daily as needed.   metoCLOPramide 10 MG tablet Commonly known as: REGLAN Take 1 tablet (10 mg total) by mouth 4 (four) times daily -  before meals and at bedtime.   ondansetron 8 MG disintegrating tablet Commonly known as: Zofran ODT Take 1 tablet (8 mg total) by mouth every 8 (eight) hours as needed for nausea or vomiting.   ondansetron 8 MG tablet Commonly known as: ZOFRAN Take 0.5 tablets (4 mg total) by mouth every 8 (eight) hours as needed for nausea or vomiting.   pantoprazole 40 MG tablet Commonly known as: Protonix Take  1 tablet (40 mg total) by mouth daily.   promethazine 25 MG suppository Commonly known as: PHENERGAN Place one suppository vaginally at night   scopolamine 1 MG/3DAYS Commonly known as: TRANSDERM-SCOP Place 1 patch (1.5 mg total) onto the skin every 3 (three) days.        Follow-up Information     [facility] Follow up on 11/10/2020.   Specialty: Obstetrics and Gynecology Contact information: [address] [place] [address] [phone]                Signed: Scheryl Darter 10/30/2020, 11:43 AM

## 2020-10-29 NOTE — Progress Notes (Signed)
Subjective: Patient reports nausea.  Tolerated small amount of oral intake  Objective: I have reviewed patient's vital signs and intake and output.  General: alert, cooperative, and no distress GI: soft, non-tender; bowel sounds normal; no masses,  no organomegaly Extremities: extremities normal, atraumatic, no cyanosis or edema   Assessment/Plan: If her diet can advance today will make plans for discharge  LOS: 5 days    Scheryl Darter 10/29/2020, 10:12 AM

## 2020-10-30 ENCOUNTER — Other Ambulatory Visit: Payer: Self-pay | Admitting: Obstetrics & Gynecology

## 2020-10-30 DIAGNOSIS — R1110 Vomiting, unspecified: Secondary | ICD-10-CM

## 2020-10-30 LAB — T3, FREE: T3, Free: 3.3 pg/mL (ref 2.0–4.4)

## 2020-10-30 MED ORDER — NIZATIDINE 300 MG PO CAPS: 300.0000 mg | ORAL_CAPSULE | Freq: Two times a day (BID) | ORAL | 1 refills | Status: DC

## 2020-10-30 MED ORDER — METOCLOPRAMIDE HCL 10 MG PO TABS: 10.0000 mg | ORAL_TABLET | Freq: Three times a day (TID) | ORAL | 2 refills | Status: DC

## 2020-10-30 MED ORDER — GLYCOPYRROLATE 2 MG PO TABS: 2.0000 mg | ORAL_TABLET | Freq: Three times a day (TID) | ORAL | 3 refills | Status: DC | PRN

## 2020-10-30 MED ORDER — PANTOPRAZOLE SODIUM 40 MG PO TBEC: 40.0000 mg | DELAYED_RELEASE_TABLET | Freq: Every day | ORAL | 1 refills | Status: DC

## 2020-11-10 ENCOUNTER — Other Ambulatory Visit (HOSPITAL_COMMUNITY)
Admission: RE | Admit: 2020-11-10 | Discharge: 2020-11-10 | Disposition: A | Discharge status: Home / Self Care | Payer: BC Managed Care – PPO | Source: Ambulatory Visit | Attending: Obstetrics and Gynecology | Admitting: Obstetrics and Gynecology

## 2020-11-10 ENCOUNTER — Other Ambulatory Visit: Payer: Self-pay

## 2020-11-10 ENCOUNTER — Ambulatory Visit (INDEPENDENT_AMBULATORY_CARE_PROVIDER_SITE_OTHER): Payer: BC Managed Care – PPO

## 2020-11-10 VITALS — BP 114/75 | HR 98 | Temp 98.6°F | Wt 156.2 lb

## 2020-11-10 DIAGNOSIS — Z3402 Encounter for supervision of normal first pregnancy, second trimester: Secondary | ICD-10-CM

## 2020-11-10 DIAGNOSIS — Z113 Encounter for screening for infections with a predominantly sexual mode of transmission: Secondary | ICD-10-CM

## 2020-11-10 DIAGNOSIS — Z3A13 13 weeks gestation of pregnancy: Secondary | ICD-10-CM | POA: Insufficient documentation

## 2020-11-10 DIAGNOSIS — Z3400 Encounter for supervision of normal first pregnancy, unspecified trimester: Secondary | ICD-10-CM | POA: Insufficient documentation

## 2020-11-10 NOTE — Progress Notes (Signed)
   Location:[facility] Patient: clinic with FOB Provider: clinic  PRENATAL INTAKE SUMMARY  Leah Vargas presents today New OB Nurse Interview.  OB History     Gravida  1   Para  0   Term      Preterm      AB      Living         SAB      IAB      Ectopic      Multiple      Live Births             I have reviewed the patient's medical, obstetrical, social, and family histories, medications, and available lab results.  SUBJECTIVE She has no unusual complaints  OBJECTIVE Initial Nurse interview for history/labs (New OB) EDD: 05/15/2021 by LMP GA: [redacted]w[redacted]d G1P0000 FHT: 131  GENERAL APPEARANCE: alert, well appearing, in no apparent distress, oriented to person, place and time   ASSESSMENT Normal pregnancy  PLAN Prenatal care:  [facility]  OB Pnl/HIV/Hep C OB Urine Culture GC/CT self swab HgbEval/SMA/CF ([facility]) Panorama   Follow Up Instructions:   I discussed the assessment and treatment plan with the patient. The patient was provided an opportunity to ask questions and all were answered. The patient agreed with the plan and demonstrated an understanding of the instructions.   The patient was advised to call back or seek an in-person evaluation if the symptoms worsen or if the condition fails to improve as anticipated.  I provided 40 minutes of  face-to-face time during this encounter.  Gearldine Shown, CMA

## 2020-11-10 NOTE — Progress Notes (Deleted)
    Nursing Staff Provider  Office Location  [facility] Dating    Language  English  Anatomy US    Flu Vaccine  Declined Genetic/Carrier Screen  NIPS:    AFP:    [facility]:  TDaP Vaccine    Hgb A1C or  GTT Early  Third trimester   COVID Vaccine  Moderma (2)   LAB RESULTS   Rhogam   Blood Type     Baby Feeding Plan Breastfeeding  Antibody    Contraception  Yes (Undetermined)  Rubella    Circumcision  Yes RPR     Pediatrician   Will Give list  HBsAg     Support Person Raphael HCVAb   Prenatal Classes Yes  HIV       BTL Consent N/A GBS   (For PCN allergy, check sensitivities)   VBAC Consent N/A Pap        DME Rx [x ] BP cuff [x ] Weight Scale Waterbirth  [ ]  Class [ ]  Consent [ ]  CNM visit  PHQ9 & GAD7 [ x ] new OB [  ] 28 weeks  [  ] 36 weeks Induction  [ ]  Orders Entered [ ] Foley Y/N

## 2020-11-11 ENCOUNTER — Encounter (HOSPITAL_COMMUNITY): Payer: Self-pay | Admitting: Obstetrics and Gynecology

## 2020-11-11 ENCOUNTER — Inpatient Hospital Stay (HOSPITAL_COMMUNITY)
Admission: AD | Admit: 2020-11-11 | Discharge: 2020-11-11 | Disposition: A | Discharge status: Home / Self Care | Payer: BC Managed Care – PPO | Attending: Obstetrics and Gynecology | Admitting: Obstetrics and Gynecology

## 2020-11-11 DIAGNOSIS — Z3A13 13 weeks gestation of pregnancy: Secondary | ICD-10-CM | POA: Insufficient documentation

## 2020-11-11 DIAGNOSIS — D509 Iron deficiency anemia, unspecified: Secondary | ICD-10-CM

## 2020-11-11 DIAGNOSIS — O99011 Anemia complicating pregnancy, first trimester: Secondary | ICD-10-CM | POA: Diagnosis not present

## 2020-11-11 DIAGNOSIS — D649 Anemia, unspecified: Secondary | ICD-10-CM | POA: Diagnosis not present

## 2020-11-11 DIAGNOSIS — O210 Mild hyperemesis gravidarum: Secondary | ICD-10-CM

## 2020-11-11 DIAGNOSIS — Z3402 Encounter for supervision of normal first pregnancy, second trimester: Secondary | ICD-10-CM

## 2020-11-11 DIAGNOSIS — O99019 Anemia complicating pregnancy, unspecified trimester: Secondary | ICD-10-CM

## 2020-11-11 DIAGNOSIS — O219 Vomiting of pregnancy, unspecified: Secondary | ICD-10-CM | POA: Diagnosis not present

## 2020-11-11 DIAGNOSIS — O99012 Anemia complicating pregnancy, second trimester: Secondary | ICD-10-CM | POA: Insufficient documentation

## 2020-11-11 HISTORY — DX: Iron deficiency anemia, unspecified: O99.019

## 2020-11-11 HISTORY — DX: Iron deficiency anemia, unspecified: D50.9

## 2020-11-11 LAB — CBC/D/PLT+RPR+RH+ABO+RUBIGG...
Antibody Screen: NEGATIVE
Basophils Absolute: 0 10*3/uL (ref 0.0–0.2)
Basos: 0 %
EOS (ABSOLUTE): 0 10*3/uL (ref 0.0–0.4)
Eos: 1 %
HCV Ab: <0.1 s/co ratio (ref 0.0–0.9)
HIV Screen 4th Generation wRfx: NONREACTIVE
Hematocrit: 28.4 % — ABNORMAL LOW (ref 34.0–46.6)
Hemoglobin: 9.4 g/dL — ABNORMAL LOW (ref 11.1–15.9)
Hepatitis B Surface Ag: NEGATIVE
Immature Grans (Abs): 0 10*3/uL (ref 0.0–0.1)
Immature Granulocytes: 0 %
Lymphocytes Absolute: 1.1 10*3/uL (ref 0.7–3.1)
Lymphs: 24 %
MCH: 24.7 pg — ABNORMAL LOW (ref 26.6–33.0)
MCHC: 33.1 g/dL (ref 31.5–35.7)
MCV: 75 fL — ABNORMAL LOW (ref 79–97)
Monocytes Absolute: 0.4 10*3/uL (ref 0.1–0.9)
Monocytes: 9 %
Neutrophils Absolute: 3 10*3/uL (ref 1.4–7.0)
Neutrophils: 66 %
Platelets: 288 10*3/uL (ref 150–450)
RBC: 3.8 x10E6/uL (ref 3.77–5.28)
RDW: 19.1 % — ABNORMAL HIGH (ref 11.7–15.4)
RPR Ser Ql: NONREACTIVE
Rh Factor: POSITIVE
Rubella Antibodies, IGG: 26.1 index (ref >0.99)
WBC: 4.5 10*3/uL (ref 3.4–10.8)

## 2020-11-11 LAB — COMPREHENSIVE METABOLIC PANEL
ALT: 13 U/L (ref 0–44)
AST: 26 U/L (ref 15–41)
Albumin: 3.3 g/dL — ABNORMAL LOW (ref 3.5–5.0)
Alkaline Phosphatase: 37 U/L — ABNORMAL LOW (ref 38–126)
Anion gap: 11 (ref 5–15)
BUN: 5 mg/dL — ABNORMAL LOW (ref 6–20)
CO2: 16 mmol/L — ABNORMAL LOW (ref 22–32)
Calcium: 8.9 mg/dL (ref 8.9–10.3)
Chloride: 105 mmol/L (ref 98–111)
Creatinine, Ser: 0.51 mg/dL (ref 0.44–1.00)
GFR, Estimated: >60 mL/min (ref >60)
Glucose, Bld: 62 mg/dL — ABNORMAL LOW (ref 70–99)
Potassium: 4 mmol/L (ref 3.5–5.1)
Sodium: 132 mmol/L — ABNORMAL LOW (ref 135–145)
Total Bilirubin: 0.9 mg/dL (ref 0.3–1.2)
Total Protein: 6.8 g/dL (ref 6.5–8.1)

## 2020-11-11 LAB — CBC
HCT: 29.5 % — ABNORMAL LOW (ref 36.0–46.0)
Hemoglobin: 9.6 g/dL — ABNORMAL LOW (ref 12.0–15.0)
MCH: 24.4 pg — ABNORMAL LOW (ref 26.0–34.0)
MCHC: 32.5 g/dL (ref 30.0–36.0)
MCV: 75.1 fL — ABNORMAL LOW (ref 80.0–100.0)
Platelets: 307 10*3/uL (ref 150–400)
RBC: 3.93 MIL/uL (ref 3.87–5.11)
RDW: 18.3 % — ABNORMAL HIGH (ref 11.5–15.5)
WBC: 5.2 10*3/uL (ref 4.0–10.5)
nRBC: 0 % (ref 0.0–0.2)

## 2020-11-11 LAB — HCV INTERPRETATION

## 2020-11-11 MED ORDER — SODIUM CHLORIDE 0.9 % IV SOLN
8.0000 mg | Freq: Once | INTRAVENOUS | Status: AC
  Administered 2020-11-11: 8 mg via INTRAVENOUS
  Filled 2020-11-11: qty 4

## 2020-11-11 MED ORDER — GLYCOPYRROLATE 0.2 MG/ML IJ SOLN
0.1000 mg | Freq: Once | INTRAMUSCULAR | Status: AC
  Administered 2020-11-11: 0.1 mg via INTRAVENOUS
  Filled 2020-11-11: qty 1

## 2020-11-11 MED ORDER — FAMOTIDINE IN NACL 20-0.9 MG/50ML-% IV SOLN
20.0000 mg | Freq: Once | INTRAVENOUS | Status: AC
  Administered 2020-11-11: 20 mg via INTRAVENOUS
  Filled 2020-11-11: qty 50

## 2020-11-11 MED ORDER — POLYSACCHARIDE IRON COMPLEX 150 MG PO CAPS: 150.0000 mg | ORAL_CAPSULE | ORAL | 1 refills | Status: DC

## 2020-11-11 MED ORDER — LACTATED RINGERS IV BOLUS
1000.0000 mL | Freq: Once | INTRAVENOUS | Status: AC
  Administered 2020-11-11: 1000 mL via INTRAVENOUS

## 2020-11-11 NOTE — MAU Provider Note (Signed)
History     CSN: [id number]  Arrival date and time: 11/11/20 1815  Chief Complaint  Patient presents with   Emesis   30 y.o. G1 @13 .4 wks presenting with N/V. She was recently admitted for HEG and reports doing week since discharge (13 days ago) until yesterday. Cannot tolerate anything po. Reports excess saliva and spitting. She has been using Pepcid, Reglan, and Robinul. Denies abdominal pain or VB.  OB History     Gravida  1   Para  0   Term      Preterm      AB      Living         SAB      IAB      Ectopic      Multiple      Live Births              Past Medical History:  Diagnosis Date   Medical history non-contributory     Past Surgical History:  Procedure Laterality Date   NO PAST SURGERIES      Family History  Problem Relation Age of Onset   Hypertension Mother    Healthy Mother    Healthy Father    Asthma Sister     Social History   Tobacco Use   Smoking status: Never    Passive exposure: Never   Smokeless tobacco: Never  Vaping Use   Vaping Use: Never used  Substance Use Topics   Alcohol use: Not Currently   Drug use: Never    Allergies: No Known Allergies  Medications Prior to Admission  Medication Sig Dispense Refill Last Dose   glycopyrrolate (ROBINUL) 2 MG tablet Take 1 tablet (2 mg total) by mouth 3 (three) times daily as needed. 30 tablet 3 11/10/2020   metoCLOPramide (REGLAN) 10 MG tablet Take 1 tablet (10 mg total) by mouth 4 (four) times daily -  before meals and at bedtime. 60 tablet 2 11/11/2020   ondansetron (ZOFRAN ODT) 8 MG disintegrating tablet Take 1 tablet (8 mg total) by mouth every 8 (eight) hours as needed for nausea or vomiting. 60 tablet 0 11/11/2020   promethazine (PHENERGAN) 25 MG suppository Place one suppository vaginally at night 12 each 1 11/11/2020   scopolamine (TRANSDERM-SCOP) 1 MG/3DAYS Place 1 patch (1.5 mg total) onto the skin every 3 (three) days. 10 patch 12 11/11/2020   nizatidine  (AXID) 300 MG capsule Take 1 capsule (300 mg total) by mouth 2 (two) times daily. (Patient not taking: Reported on 11/10/2020) 60 capsule 1    ondansetron (ZOFRAN) 8 MG tablet Take 0.5 tablets (4 mg total) by mouth every 8 (eight) hours as needed for nausea or vomiting. 20 tablet 0    pantoprazole (PROTONIX) 40 MG tablet Take 1 tablet (40 mg total) by mouth daily. 30 tablet 1     Review of Systems  Constitutional:  Negative for chills and fever.  Gastrointestinal:  Positive for nausea and vomiting. Negative for abdominal pain.  Genitourinary:  Negative for vaginal bleeding.  Physical Exam   Blood pressure (!) 112/58, pulse 89, temperature 98.2 F (36.8 C), temperature source Oral, resp. rate 18, last menstrual period 08/12/2020, SpO2 100 %.  Physical Exam Vitals and nursing note reviewed.  Constitutional:      General: She is in acute distress (actively vomiting clear fluid).     Appearance: Normal appearance.  HENT:     Head: Normocephalic.  Pulmonary:     Effort: Pulmonary effort  is normal. No respiratory distress.  Musculoskeletal:        General: Normal range of motion.     Cervical back: Normal range of motion.  Neurological:     General: No focal deficit present.     Mental Status: She is alert and oriented to person, place, and time.  Psychiatric:        Mood and Affect: Mood normal.        Behavior: Behavior normal.   Results for orders placed or performed during the hospital encounter of 11/11/20 (from the past 24 hour(s))  CBC     Status: Abnormal   Collection Time: 11/11/20  8:27 PM  Result Value Ref Range   WBC 5.2 4.0 - 10.5 K/uL   RBC 3.93 3.87 - 5.11 MIL/uL   Hemoglobin 9.6 (L) 12.0 - 15.0 g/dL   HCT 24.2 (L) 35.3 - 61.4 %   MCV 75.1 (L) 80.0 - 100.0 fL   MCH 24.4 (L) 26.0 - 34.0 pg   MCHC 32.5 30.0 - 36.0 g/dL   RDW 43.1 (H) 54.0 - 08.6 %   Platelets 307 150 - 400 K/uL   nRBC 0.0 0.0 - 0.2 %  Comprehensive metabolic panel     Status: Abnormal    Collection Time: 11/11/20  8:27 PM  Result Value Ref Range   Sodium 132 (L) 135 - 145 mmol/L   Potassium 4.0 3.5 - 5.1 mmol/L   Chloride 105 98 - 111 mmol/L   CO2 16 (L) 22 - 32 mmol/L   Glucose, Bld 62 (L) 70 - 99 mg/dL   BUN 5 (L) 6 - 20 mg/dL   Creatinine, Ser 7.61 0.44 - 1.00 mg/dL   Calcium 8.9 8.9 - 95.0 mg/dL   Total Protein 6.8 6.5 - 8.1 g/dL   Albumin 3.3 (L) 3.5 - 5.0 g/dL   AST 26 15 - 41 U/L   ALT 13 0 - 44 U/L   Alkaline Phosphatase 37 (L) 38 - 126 U/L   Total Bilirubin 0.9 0.3 - 1.2 mg/dL   GFR, Estimated >93 >26 mL/min   Anion gap 11 5 - 15   MAU Course  Procedures LR Zofran Pepcid Robinul  MDM Labs ordered and reviewed. Feeling much better. No further emesis. Tolerating po. Instructed pt she could use Zofran or Reglan and continue Pepcid, Robinul, and Scop. Stable for discharge home.   Assessment and Plan   1. Encounter for supervision of normal first pregnancy in second trimester   2. [redacted] weeks gestation of pregnancy   3. Anemia affecting pregnancy in second trimester   4. Morning sickness    Discharge home Follow up at [facility] as scheduled Continue Pepcid, Zofran, Robinul, and Scop Rx Ferrex Return precautions  Allergies as of 11/11/2020   No Known Allergies      Medication List     STOP taking these medications    nizatidine 300 MG capsule Commonly known as: AXID       TAKE these medications    glycopyrrolate 2 MG tablet Commonly known as: ROBINUL Take 1 tablet (2 mg total) by mouth 3 (three) times daily as needed.   iron polysaccharides 150 MG capsule Commonly known as: NIFEREX Take 1 capsule (150 mg total) by mouth every other day.   metoCLOPramide 10 MG tablet Commonly known as: REGLAN Take 1 tablet (10 mg total) by mouth 4 (four) times daily -  before meals and at bedtime.   ondansetron 8 MG disintegrating tablet Commonly  known as: Zofran ODT Take 1 tablet (8 mg total) by mouth every 8 (eight) hours as needed for nausea  or vomiting.   ondansetron 8 MG tablet Commonly known as: ZOFRAN Take 0.5 tablets (4 mg total) by mouth every 8 (eight) hours as needed for nausea or vomiting.   pantoprazole 40 MG tablet Commonly known as: Protonix Take 1 tablet (40 mg total) by mouth daily.   promethazine 25 MG suppository Commonly known as: PHENERGAN Place one suppository vaginally at night   scopolamine 1 MG/3DAYS Commonly known as: TRANSDERM-SCOP Place 1 patch (1.5 mg total) onto the skin every 3 (three) days.        Donette Larry, CNM 11/11/2020, 10:07 PM

## 2020-11-11 NOTE — MAU Note (Signed)
.  Leah Vargas is a 30 y.o. at [redacted]w[redacted]d here in MAU reporting: n/v since yesterday. Has not been able to keep anything down. Denies abdominal pain or bleeding. No diarrhea.   Vitals:   11/11/20 1836  BP: (!) 110/58  Pulse: (!) 102  Resp: 17  Temp: 98.2 F (36.8 C)

## 2020-11-12 LAB — CERVICOVAGINAL ANCILLARY ONLY
Bacterial Vaginitis (gardnerella): POSITIVE — AB
Candida Glabrata: NEGATIVE
Candida Vaginitis: POSITIVE — AB
Chlamydia: NEGATIVE
Comment: NEGATIVE
Comment: NEGATIVE
Comment: NEGATIVE
Comment: NEGATIVE
Comment: NEGATIVE
Comment: NORMAL
Neisseria Gonorrhea: NEGATIVE
Trichomonas: NEGATIVE

## 2020-11-14 ENCOUNTER — Other Ambulatory Visit: Payer: Self-pay | Admitting: Certified Nurse Midwife

## 2020-11-14 DIAGNOSIS — B3731 Acute candidiasis of vulva and vagina: Secondary | ICD-10-CM

## 2020-11-14 DIAGNOSIS — N760 Acute vaginitis: Secondary | ICD-10-CM

## 2020-11-14 DIAGNOSIS — B9689 Other specified bacterial agents as the cause of diseases classified elsewhere: Secondary | ICD-10-CM

## 2020-11-14 MED ORDER — TERCONAZOLE 0.4 % VA CREA: 1.0000 | TOPICAL_CREAM | Freq: Every day | VAGINAL | 0 refills | Status: DC

## 2020-11-14 MED ORDER — METRONIDAZOLE 0.75 % VA GEL: 1.0000 | Freq: Every day | VAGINAL | 0 refills | Status: DC

## 2020-11-26 ENCOUNTER — Ambulatory Visit (INDEPENDENT_AMBULATORY_CARE_PROVIDER_SITE_OTHER): Payer: BC Managed Care – PPO | Admitting: Certified Nurse Midwife

## 2020-11-26 ENCOUNTER — Other Ambulatory Visit: Payer: Self-pay

## 2020-11-26 VITALS — BP 92/63 | HR 94 | Temp 97.6°F | Wt 147.6 lb

## 2020-11-26 DIAGNOSIS — O210 Mild hyperemesis gravidarum: Secondary | ICD-10-CM

## 2020-11-26 DIAGNOSIS — Z3400 Encounter for supervision of normal first pregnancy, unspecified trimester: Secondary | ICD-10-CM

## 2020-11-26 DIAGNOSIS — O0932 Supervision of pregnancy with insufficient antenatal care, second trimester: Secondary | ICD-10-CM

## 2020-11-26 DIAGNOSIS — Z3A15 15 weeks gestation of pregnancy: Secondary | ICD-10-CM

## 2020-11-26 DIAGNOSIS — D508 Other iron deficiency anemias: Secondary | ICD-10-CM

## 2020-11-26 DIAGNOSIS — D573 Sickle-cell trait: Secondary | ICD-10-CM

## 2020-11-26 MED ORDER — ONDANSETRON 8 MG PO TBDP: 8.0000 mg | ORAL_TABLET | Freq: Three times a day (TID) | ORAL | 2 refills | Status: DC | PRN

## 2020-11-26 NOTE — Progress Notes (Signed)
History:   Leah Vargas is a 29 y.o. G1P0 at [redacted]w[redacted]d by LMP, early ultrasound being seen today for her first obstetrical visit.  This is her first pregnancy. Patient does intend to breast feed. Pregnancy history fully reviewed.  Patient reports  fatigue, nausea, vomiting, and spitting. Has been seen in MAU several times for this problem, and was admitted to [facility] for management. Is now taking zofran, robinul, and "famotidine" for her nausea which is helping somewhat, but she is still very nauseated and vomiting 1-2x/day. Able to eat rice and beans occasionally but little else. Feeling very fatigued and weak, frustrated with lack of improvement. Feels better after fluids and IV meds .    HISTORY: OB History  Gravida Para Term Preterm AB Living  1 0 0 0 0 0  SAB IAB Ectopic Multiple Live Births  0 0 0 0 0    # Outcome Date GA Lbr Len/2nd Weight Sex Delivery Anes PTL Lv  1 Current             Last pap smear date unknown, declines today due to nausea.  Past Medical History:  Diagnosis Date   Medical history non-contributory    Past Surgical History:  Procedure Laterality Date   NO PAST SURGERIES     Family History  Problem Relation Age of Onset   Hypertension Mother    Healthy Mother    Healthy Father    Asthma Sister    Social History   Tobacco Use   Smoking status: Never    Passive exposure: Never   Smokeless tobacco: Never  Vaping Use   Vaping Use: Never used  Substance Use Topics   Alcohol use: Not Currently   Drug use: Never   No Known Allergies Current Outpatient Medications on File Prior to Visit  Medication Sig Dispense Refill   iron polysaccharides (NIFEREX) 150 MG capsule Take 1 capsule (150 mg total) by mouth every other day. 30 capsule 1   metroNIDAZOLE (METROGEL) 0.75 % vaginal gel Place 1 Applicatorful vaginally at bedtime. 70 g 0   No current facility-administered medications on file prior to visit.   Review of Systems Pertinent items noted in  HPI and remainder of comprehensive ROS otherwise negative. Physical Exam:   Vitals:   11/26/20 1323  BP: 92/63  Pulse: 94  Temp: 97.6 F (36.4 C)  Weight: 147 lb 9.6 oz (67 kg)   Fetal Heart Rate (bpm): 151  Constitutional: Well-developed, well-nourished pregnant female, appears fatigued but in no acute distress. Spit frequently throughout visit. HEENT: PERRLA Skin: normal color and turgor, no rash Cardiovascular: normal rate & rhythm Respiratory: normal effort GI: Abd soft, non-tender, pos BS x 4, gravid appropriate for gestational age MS: Extremities nontender, no edema, normal ROM Neurologic: Alert and oriented x 4.  GU: no CVA tenderness Pelvic: exam deferred  Assessment & Plan:  1. Supervision of normal first pregnancy, antepartum - Starting to feel flutters of movement, having some ambivalent feelings considering how ill she is. - AFP, Serum, Open Spina Bifida  2. [redacted] weeks gestation of pregnancy - Routine OB care  - AFP, Serum, Open Spina Bifida  3. Hyperemesis affecting pregnancy, antepartum - Offered weekly infusions for a month to see if that helps her function with the hyperemesis, pt accepted to infusion orders placed - lactated ringers bolus 1,000 mL - promethazine (PHENERGAN) 25 mg in lactated ringers 1,000 mL infusion  4. Sickle-cell trait (HCC) - She and FOB are both carriers, needs genetic  counseling for further discussion/testing - AMB referral to maternal fetal medicine  5. Iron deficiency anemia secondary to inadequate dietary iron intake - Iron infusion added to first infusion visit since she is unable to keep down her oral iron but hgb is very low (9.4) - iron sucrose (VENOFER) 500 mg in sodium chloride 0.9 % 250 mL IVPB  6. Initial OB visit, second trimester - Initial labs reviewed - Continue prenatal vitamins when possible - Problem list reviewed and updated. - Genetic Screening discussed, First trimester screen, Quad screen, and NIPS: results  reviewed. - Ultrasound discussed; fetal anatomic survey: ordered. - Anticipatory guidance about prenatal visits given including labs, ultrasounds, and testing. - Discussed usage of Babyscripts and virtual visits as additional source of managing and completing prenatal visits in midst of coronavirus and pandemic.   - Encouraged to complete MyChart Registration for her ability to review results, send requests, and have questions addressed.  - The nature of [facility] with multiple MDs and Advanced Practice Providers was explained to patient; also emphasized that residents, students are part of our team. - Routine obstetric precautions reviewed. Encouraged to seek out care at office or emergency room [facility] MAU preferred) for urgent and/or emergent concerns.   Follow up in one month for in-person, LOB.    Edd Arbour, MSN, CNM, IBCLC Certified Nurse Midwife, [facility]

## 2020-11-27 ENCOUNTER — Inpatient Hospital Stay (HOSPITAL_COMMUNITY)
Admission: AD | Admit: 2020-11-27 | Discharge: 2020-11-27 | Disposition: A | Discharge status: Home / Self Care | Payer: BC Managed Care – PPO | Attending: Obstetrics & Gynecology | Admitting: Obstetrics & Gynecology

## 2020-11-27 ENCOUNTER — Encounter (HOSPITAL_COMMUNITY): Payer: Self-pay | Admitting: Obstetrics & Gynecology

## 2020-11-27 DIAGNOSIS — O210 Mild hyperemesis gravidarum: Secondary | ICD-10-CM | POA: Diagnosis present

## 2020-11-27 DIAGNOSIS — T450X6A Underdosing of antiallergic and antiemetic drugs, initial encounter: Secondary | ICD-10-CM | POA: Diagnosis not present

## 2020-11-27 DIAGNOSIS — Z3402 Encounter for supervision of normal first pregnancy, second trimester: Secondary | ICD-10-CM

## 2020-11-27 DIAGNOSIS — Z91128 Patient's intentional underdosing of medication regimen for other reason: Secondary | ICD-10-CM | POA: Diagnosis not present

## 2020-11-27 DIAGNOSIS — R1110 Vomiting, unspecified: Secondary | ICD-10-CM

## 2020-11-27 DIAGNOSIS — Z3A15 15 weeks gestation of pregnancy: Secondary | ICD-10-CM

## 2020-11-27 DIAGNOSIS — O99012 Anemia complicating pregnancy, second trimester: Secondary | ICD-10-CM

## 2020-11-27 LAB — COMPREHENSIVE METABOLIC PANEL
ALT: 14 U/L (ref 0–44)
AST: 20 U/L (ref 15–41)
Albumin: 3.8 g/dL (ref 3.5–5.0)
Alkaline Phosphatase: 41 U/L (ref 38–126)
Anion gap: 11 (ref 5–15)
BUN: <5 mg/dL — ABNORMAL LOW (ref 6–20)
CO2: 20 mmol/L — ABNORMAL LOW (ref 22–32)
Calcium: 9.5 mg/dL (ref 8.9–10.3)
Chloride: 101 mmol/L (ref 98–111)
Creatinine, Ser: 0.52 mg/dL (ref 0.44–1.00)
GFR, Estimated: >60 mL/min (ref >60)
Glucose, Bld: 65 mg/dL — ABNORMAL LOW (ref 70–99)
Potassium: 3.4 mmol/L — ABNORMAL LOW (ref 3.5–5.1)
Sodium: 132 mmol/L — ABNORMAL LOW (ref 135–145)
Total Bilirubin: 0.8 mg/dL (ref 0.3–1.2)
Total Protein: 8 g/dL (ref 6.5–8.1)

## 2020-11-27 MED ORDER — GLYCOPYRROLATE 0.2 MG/ML IJ SOLN
0.1000 mg | Freq: Once | INTRAMUSCULAR | Status: AC
  Administered 2020-11-27: 0.1 mg via INTRAVENOUS
  Filled 2020-11-27: qty 1

## 2020-11-27 MED ORDER — SODIUM CHLORIDE 0.9 % IV SOLN
8.0000 mg | Freq: Once | INTRAVENOUS | Status: AC
  Administered 2020-11-27: 8 mg via INTRAVENOUS
  Filled 2020-11-27: qty 4

## 2020-11-27 MED ORDER — SODIUM CHLORIDE 0.9 % IV SOLN
25.0000 mg | Freq: Once | INTRAVENOUS | Status: AC
  Administered 2020-11-27: 25 mg via INTRAVENOUS
  Filled 2020-11-27: qty 1

## 2020-11-27 MED ORDER — METOCLOPRAMIDE HCL 10 MG PO TABS: 10.0000 mg | ORAL_TABLET | Freq: Three times a day (TID) | ORAL | 2 refills | Status: DC

## 2020-11-27 MED ORDER — LACTATED RINGERS IV BOLUS
1000.0000 mL | Freq: Once | INTRAVENOUS | Status: AC
  Administered 2020-11-27: 1000 mL via INTRAVENOUS

## 2020-11-27 MED ORDER — ONDANSETRON 8 MG PO TBDP: 8.0000 mg | ORAL_TABLET | Freq: Three times a day (TID) | ORAL | 2 refills | Status: DC | PRN

## 2020-11-27 MED ORDER — PANTOPRAZOLE SODIUM 40 MG PO TBEC: 40.0000 mg | DELAYED_RELEASE_TABLET | Freq: Every day | ORAL | 1 refills | Status: DC

## 2020-11-27 MED ORDER — PROMETHAZINE HCL 25 MG RE SUPP: RECTAL | 1 refills | Status: DC

## 2020-11-27 MED ORDER — GLYCOPYRROLATE 2 MG PO TABS: 2.0000 mg | ORAL_TABLET | Freq: Three times a day (TID) | ORAL | 3 refills | Status: DC | PRN

## 2020-11-27 MED ORDER — METOCLOPRAMIDE HCL 5 MG/ML IJ SOLN
10.0000 mg | Freq: Once | INTRAMUSCULAR | Status: AC
  Administered 2020-11-27: 10 mg via INTRAVENOUS
  Filled 2020-11-27: qty 2

## 2020-11-27 NOTE — MAU Provider Note (Signed)
History     [id number]  Arrival date and time: 11/27/20 1136    Chief Complaint  Patient presents with   Emesis     HPI Leah Vargas is a 30 y.o. at [redacted]w[redacted]d by 10 wk [place] with PMHx notable for hyperemesis, who presents for nausea and vomiting.   Patient reports two days of severe vomiting and inability to eat solids or liquids No blood in vomit Reports she was feeling well a few days ago so she stopped taking her medications She is worried about taking them constantly and the side effects it could have on her and baby No vaginal bleeding No loss of fluid No abdominal pain  A/Positive/-- (10/10 0942)  OB History     Gravida  1   Para  0   Term      Preterm      AB      Living         SAB      IAB      Ectopic      Multiple      Live Births              Past Medical History:  Diagnosis Date   Medical history non-contributory     Past Surgical History:  Procedure Laterality Date   NO PAST SURGERIES      Family History  Problem Relation Age of Onset   Hypertension Mother    Healthy Mother    Healthy Father    Asthma Sister     Social History   Socioeconomic History   Marital status: Single    Spouse name: Nancy Marus   Number of children: Not on file   Years of education: Not on file   Highest education level: Master's degree (e.g., MA, MS, MEng, MEd, MSW, MBA)  Occupational History   Occupation: Consulting civil engineer    Comment: [employer]  Tobacco Use   Smoking status: Never    Passive exposure: Never   Smokeless tobacco: Never  Vaping Use   Vaping Use: Never used  Substance and Sexual Activity   Alcohol use: Not Currently   Drug use: Never   Sexual activity: Not Currently  Other Topics Concern   Not on file  Social History Narrative   Not on file   Social Determinants of Health   Financial Resource Strain: Not on file  Food Insecurity: Not on file  Transportation Needs: Not on file  Physical Activity: Not on file  Stress:  Not on file  Social Connections: Not on file  Intimate Partner Violence: Not on file    No Known Allergies  No current facility-administered medications on file prior to encounter.   Current Outpatient Medications on File Prior to Encounter  Medication Sig Dispense Refill   famotidine (PEPCID) 10 MG tablet Take 10 mg by mouth 2 (two) times daily.     iron polysaccharides (NIFEREX) 150 MG capsule Take 1 capsule (150 mg total) by mouth every other day. 30 capsule 1   metroNIDAZOLE (METROGEL) 0.75 % vaginal gel Place 1 Applicatorful vaginally at bedtime. 70 g 0   ondansetron (ZOFRAN) 8 MG tablet Take 0.5 tablets (4 mg total) by mouth every 8 (eight) hours as needed for nausea or vomiting. (Patient not taking: No sig reported) 20 tablet 0   scopolamine (TRANSDERM-SCOP) 1 MG/3DAYS Place 1 patch (1.5 mg total) onto the skin every 3 (three) days. (Patient not taking: Reported on 11/26/2020) 10 patch 12   terconazole (TERAZOL 7)  0.4 % vaginal cream Place 1 applicator vaginally at bedtime. Use for seven days (Patient not taking: Reported on 11/26/2020) 45 g 0     ROS Pertinent positives and negative per HPI, all others reviewed and negative  Physical Exam   BP 112/63   Pulse (!) 101   Resp 17   LMP 08/12/2020 (Exact Date)   SpO2 100%   Patient Vitals for the past 24 hrs:  BP Pulse Resp SpO2  11/27/20 1202 112/63 (!) 101 -- 100 %  11/27/20 1157 103/62 99 17 100 %    Physical Exam Vitals reviewed.  Constitutional:      General: She is not in acute distress.    Appearance: She is well-developed. She is not diaphoretic.  Eyes:     General: No scleral icterus. Pulmonary:     Effort: Pulmonary effort is normal. No respiratory distress.  Skin:    General: Skin is warm and dry.  Neurological:     Mental Status: She is alert.     Coordination: Coordination normal.     Cervical Exam    Bedside Ultrasound Not done  My interpretation: n/a  FHT FHR 150 bpm by  doppler  Labs Results for orders placed or performed during the hospital encounter of 11/27/20 (from the past 24 hour(s))  Comprehensive metabolic panel     Status: Abnormal   Collection Time: 11/27/20 12:17 PM  Result Value Ref Range   Sodium 132 (L) 135 - 145 mmol/L   Potassium 3.4 (L) 3.5 - 5.1 mmol/L   Chloride 101 98 - 111 mmol/L   CO2 20 (L) 22 - 32 mmol/L   Glucose, Bld 65 (L) 70 - 99 mg/dL   BUN <5 (L) 6 - 20 mg/dL   Creatinine, Ser 0.93 0.44 - 1.00 mg/dL   Calcium 9.5 8.9 - 26.7 mg/dL   Total Protein 8.0 6.5 - 8.1 g/dL   Albumin 3.8 3.5 - 5.0 g/dL   AST 20 15 - 41 U/L   ALT 14 0 - 44 U/L   Alkaline Phosphatase 41 38 - 126 U/L   Total Bilirubin 0.8 0.3 - 1.2 mg/dL   GFR, Estimated >12 >45 mL/min   Anion gap 11 5 - 15    Imaging No results found.  MAU Course  Procedures Lab Orders         Urinalysis, Routine w reflex microscopic Urine, Clean Catch         Comprehensive metabolic panel    Meds ordered this encounter  Medications   lactated ringers bolus 1,000 mL   ondansetron (ZOFRAN) 8 mg in sodium chloride 0.9 % 50 mL IVPB   metoCLOPramide (REGLAN) injection 10 mg   promethazine (PHENERGAN) 25 mg in sodium chloride 0.9 % 1,000 mL infusion   glycopyrrolate (ROBINUL) injection 0.1 mg   glycopyrrolate (ROBINUL) 2 MG tablet    Sig: Take 1 tablet (2 mg total) by mouth 3 (three) times daily as needed.    Dispense:  30 tablet    Refill:  3   metoCLOPramide (REGLAN) 10 MG tablet    Sig: Take 1 tablet (10 mg total) by mouth 4 (four) times daily -  before meals and at bedtime.    Dispense:  60 tablet    Refill:  2   ondansetron (ZOFRAN ODT) 8 MG disintegrating tablet    Sig: Take 1 tablet (8 mg total) by mouth every 8 (eight) hours as needed for nausea or vomiting.    Dispense:  60  tablet    Refill:  2   pantoprazole (PROTONIX) 40 MG tablet    Sig: Take 1 tablet (40 mg total) by mouth daily.    Dispense:  30 tablet    Refill:  1   promethazine (PHENERGAN) 25 MG  suppository    Sig: Place one suppository vaginally at night    Dispense:  12 each    Refill:  1   Imaging Orders  No imaging studies ordered today    MDM moderate  Assessment and Plan  #Hyperemesis gravidarum Presentation c/w HG. Discussed with patient that regularly taking her anti-emetics will help to avoid restarting cycle of n/v. Discussed that concerns about side effects on both her and baby are valid, however there are risks to uncontrolled HG/weight loss/etc. And that in this case benefits outweigh risks in our medical opinion. Will treat with cocktail of anti-emetics and reassess.  12:47 PM  Patient improved with above interventions. Refills sent to pharmacy. D/c home with return precautions.  2:32 PM   #FWB Normal fetal heart tones by doppler   Dispo: Home in stable condition.    Venora Maples, MD/MPH 11/27/20 2:32 PM  Allergies as of 11/27/2020   No Known Allergies      Medication List     STOP taking these medications    famotidine 10 MG tablet Commonly known as: PEPCID   ondansetron 8 MG tablet Commonly known as: ZOFRAN   scopolamine 1 MG/3DAYS Commonly known as: TRANSDERM-SCOP   terconazole 0.4 % vaginal cream Commonly known as: TERAZOL 7       TAKE these medications    glycopyrrolate 2 MG tablet Commonly known as: ROBINUL Take 1 tablet (2 mg total) by mouth 3 (three) times daily as needed.   iron polysaccharides 150 MG capsule Commonly known as: NIFEREX Take 1 capsule (150 mg total) by mouth every other day.   metoCLOPramide 10 MG tablet Commonly known as: REGLAN Take 1 tablet (10 mg total) by mouth 4 (four) times daily -  before meals and at bedtime.   metroNIDAZOLE 0.75 % vaginal gel Commonly known as: METROGEL Place 1 Applicatorful vaginally at bedtime.   ondansetron 8 MG disintegrating tablet Commonly known as: Zofran ODT Take 1 tablet (8 mg total) by mouth every 8 (eight) hours as needed for nausea or vomiting.    pantoprazole 40 MG tablet Commonly known as: Protonix Take 1 tablet (40 mg total) by mouth daily.   promethazine 25 MG suppository Commonly known as: PHENERGAN Place one suppository vaginally at night

## 2020-11-27 NOTE — MAU Note (Signed)
Patient states she cannot urinate due to lack of po intake. RN encouraged patient to at least try as the kidneys are always filtering blood. Patient refused.

## 2020-11-27 NOTE — MAU Note (Addendum)
...  Leah Vargas is a 30 y.o. at [redacted]w[redacted]d here in MAU reporting: nausea and vomiting since Tuesday evening. She states she took Zofran yesterday and this morning at 0200. She has not used her phenergan suppositories. She states she has thrown up more than ten times in the past 24 hours. She states she tried to eat rice yesterday morning and a chocolate drink last night but was unable to keep it down.  Pain score: 0/10  Lab orders placed from triage: UA

## 2020-11-28 LAB — AFP, SERUM, OPEN SPINA BIFIDA
AFP MoM: 0.91
AFP Value: 31.2 ng/mL
Gest. Age on Collection Date: 15 weeks
Maternal Age At EDD: 29.9 yr
OSBR Risk 1 IN: 10000
Test Results:: NEGATIVE
Weight: 147 [lb_av]

## 2020-12-03 ENCOUNTER — Ambulatory Visit: Payer: BC Managed Care – PPO | Attending: Certified Nurse Midwife

## 2020-12-03 ENCOUNTER — Other Ambulatory Visit: Payer: Self-pay | Admitting: *Deleted

## 2020-12-03 ENCOUNTER — Encounter: Payer: Self-pay | Admitting: Certified Nurse Midwife

## 2020-12-03 ENCOUNTER — Telehealth: Payer: Self-pay

## 2020-12-03 ENCOUNTER — Other Ambulatory Visit: Payer: Self-pay

## 2020-12-03 DIAGNOSIS — O352XX0 Maternal care for (suspected) hereditary disease in fetus, not applicable or unspecified: Secondary | ICD-10-CM

## 2020-12-03 DIAGNOSIS — D573 Sickle-cell trait: Secondary | ICD-10-CM

## 2020-12-03 DIAGNOSIS — O99019 Anemia complicating pregnancy, unspecified trimester: Secondary | ICD-10-CM | POA: Diagnosis not present

## 2020-12-03 NOTE — Progress Notes (Addendum)
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   EDC: 05/15/2021 LMP: 08/12/2020 Referring Provider:  Robbie Lis, RN  EGA: [redacted]w[redacted]d Genetic Counselor: Teena Dunk, MS, CGC  OB Hx: G1P0 Date of Appointment: 12/03/2020  Accompanied by: Father of the pregnancy, Onica Davidovich DOB 02/05/89 Face to Face Time: 30 Minutes   Previous Testing Completed: Oluwatamilore previously completed Non-Invasive Prenatal Screening (NIPS) in this pregnancy (scanned into Epic under the Media tab). The result is low risk, consistent with a female fetus. This screening significantly reduces the risk that the current pregnancy has Down syndrome, Trisomy 53, Trisomy 13, Monosomy X, and Triploidy, however, the risk is not zero given the limitations of NIPS. Additionally, there are many genetic conditions that cannot be detected by NIPS.  Oluwatamilore previously completed Lawyer (scanned into Epic under the Media tab). 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 She screened to not be a carrier for Cystic Fibrosis (CF), Spinal Muscular Atrophy (SMA), and alpha thalassemia. A negative result on carrier screening reduces the likelihood of being a carrier, however, does not entirely rule out the possibility.   Medical & Family History: Denies personal history of diabetes, high blood pressure, thyroid conditions, and seizures. Denies using tobacco, alcohol, or street drugs in this pregnancy.  Nancy Marus reports he is affected with Sickle-Hemoglobin C Disease (Hb S/C). No medical records or genetic testing results for Nancy Marus are available for genetic counseling to review.  Maternal ethnicity reported as Black and paternal ethnicity reported as Black. Denies Ashkenazi Jewish ancestry. Family history not remarkable for  consanguinity, individuals with birth defects, intellectual disability, autism spectrum disorder, mental illness, multiple spontaneous abortions, still births, or unexplained neonatal death.     Genetic Counseling:   Risk for Sickle Cell Disease & Sickle-Hemoglobin C Disease. 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 Her reproductive partner reports he is affected with Sickle-Hemoglobin C Disease (Hb S/C). 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  Red blood cells with normal hemoglobin are smooth and disk-shaped and can move through blood vessels easily. Cells with sickle cell hemoglobin are stiff and sticky. When they lose their oxygen they form into the shape of a sickle or crescent and can't easily move through blood vessels. Most individuals with Sickle Cell Disease are healthy at birth and become symptomatic later in infancy or childhood after fetal hemoglobin levels decrease. Anemia is a common symptom because sickled cells are short-lived (10 to 20 days compared to normal red blood cells that can live up to 120 days). Pain crises occur when the flow of blood is blocked to an area because sickled cells have become stuck in the blood vessel. This pain can occur anywhere, but most often occurs in the chest, arms, and legs. Infants and young children may also have painful swelling of the fingers and toes. Acute chest syndrome happens when sickling occurs in the chest. This can be  life-threatening as sickled cells block the flow of oxygen in the vessels of the lungs. Splenic  sequestration occurs when sickled cells pool in the spleen causing a sudden drop in hemoglobin. Most children, by age 22, do not have a working spleen either from surgical removal, or from repeated episodes of splenic sequestration. Therefore, the risk of infection is a major concern for children without a working spleen. Childhood stroke is also a severe complication people with Sickle Cell Disease can experience as the sickled cells can block the major blood vessels that supply the brain with oxygen. Jaundice of the skin, eyes, and mouth and priapism are also symptoms individuals affected with Sickle Cell Disease commonly experience. Individuals with Sickle-Hemoglobin C Disease (Hb S/C) may have symptoms that are generally similar to but less severe than individuals affected with Sickle Cell Disease (Hb S/S).   Given the risk the current pregnancy has to be affected, genetic counseling offered Oluwatamilore amniocentesis for prenatal diagnosis. Possible procedural difficulties and complications that can arise include maternal infection, cramping, bleeding, fluid leakage, and/or pregnancy loss. The risk for pregnancy loss with an amniocentesis is 1/500. Per the [employer] of [employer]stetricians and Gynecologists (ACOG) Practice Bulletin 162, all pregnant women should be offered prenatal assessment for aneuploidy by diagnostic testing regardless of maternal age or other risk factors. If indicated, genetic testing that could be ordered on an amniocentesis sample includes a fetal karyotype, fetal microarray, and testing for specific syndromes such as Sickle Cell Disease and Sickle-Hemoglobin C Disease.Oluwatamilore accepted amniocentesis for prenatal diagnosis and was [facility] on 12/11/2020.    Patient Plan:  Proceed with: Amniocentesis for prenatal diagnosis. Genetic testing will be performed by [facility].  ICD-10 codes: O99.019, D57.3,  O35.2XX0 CPT codes: 95621, [id number], [id number], [id number], [id number], [id number], [id number]  Informed consent was obtained. All questions were answered.   Thank you for sharing in the care of Oluwatamilore with [place].  Please do not hesitate to contact us if you have any questions.  Teena Dunk, MS, [facility]

## 2020-12-03 NOTE — Telephone Encounter (Signed)
Spoke with patient about Amnio and Detail, Anatomy scheduled for 12/11/20

## 2020-12-11 ENCOUNTER — Encounter: Payer: Self-pay | Admitting: *Deleted

## 2020-12-11 ENCOUNTER — Other Ambulatory Visit: Payer: Self-pay

## 2020-12-11 ENCOUNTER — Ambulatory Visit: Payer: BC Managed Care – PPO

## 2020-12-11 ENCOUNTER — Other Ambulatory Visit: Payer: Self-pay | Admitting: Obstetrics

## 2020-12-11 ENCOUNTER — Ambulatory Visit: Payer: BC Managed Care – PPO | Attending: Obstetrics

## 2020-12-11 ENCOUNTER — Ambulatory Visit: Payer: BC Managed Care – PPO | Admitting: *Deleted

## 2020-12-11 VITALS — BP 104/61 | HR 93

## 2020-12-11 DIAGNOSIS — Z3689 Encounter for other specified antenatal screening: Secondary | ICD-10-CM | POA: Diagnosis not present

## 2020-12-11 DIAGNOSIS — Z3402 Encounter for supervision of normal first pregnancy, second trimester: Secondary | ICD-10-CM

## 2020-12-11 DIAGNOSIS — O99019 Anemia complicating pregnancy, unspecified trimester: Secondary | ICD-10-CM

## 2020-12-11 DIAGNOSIS — O321XX0 Maternal care for breech presentation, not applicable or unspecified: Secondary | ICD-10-CM | POA: Diagnosis not present

## 2020-12-11 DIAGNOSIS — O99012 Anemia complicating pregnancy, second trimester: Secondary | ICD-10-CM | POA: Diagnosis not present

## 2020-12-11 DIAGNOSIS — O352XX0 Maternal care for (suspected) hereditary disease in fetus, not applicable or unspecified: Secondary | ICD-10-CM

## 2020-12-11 DIAGNOSIS — D573 Sickle-cell trait: Secondary | ICD-10-CM

## 2020-12-11 DIAGNOSIS — Z3A17 17 weeks gestation of pregnancy: Secondary | ICD-10-CM | POA: Diagnosis not present

## 2020-12-11 DIAGNOSIS — Z363 Encounter for antenatal screening for malformations: Secondary | ICD-10-CM | POA: Diagnosis not present

## 2020-12-11 NOTE — Addendum Note (Signed)
Addended by: Teena Dunk on: 12/11/2020 11:34 AM   Modules accepted: Orders

## 2020-12-23 ENCOUNTER — Telehealth: Payer: Self-pay | Admitting: Genetics

## 2020-12-23 LAB — MCC TRACKING

## 2020-12-23 LAB — CHROMOSOME, AMNIOTIC FLUID
Cells Analyzed: 15
Cells Counted: 15
Cells Karyotyped: 2
Colonies: 15
GTG Band Resolution Achieved: 450

## 2020-12-23 LAB — SPECIMEN STATUS REPORT

## 2020-12-23 NOTE — Telephone Encounter (Signed)
Called Leah Vargas to return normal amniotic fluid karyotype result (46,XX). The sickle cell testing is still pending. All questions answered.

## 2020-12-29 ENCOUNTER — Telehealth: Payer: Self-pay | Admitting: Genetics

## 2020-12-29 LAB — SICKLE CELL, PRENATAL, DNA

## 2020-12-29 NOTE — Telephone Encounter (Signed)
Called Leah Vargas with results from the sickle cell testing on her amniotic fluid. 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 Leah Vargas did not have any questions for genetic counseling at this time. After hearing the information Leah Vargas stated she will call genetic counseling back if she has questions.

## 2020-12-31 ENCOUNTER — Other Ambulatory Visit: Payer: Self-pay | Admitting: *Deleted

## 2020-12-31 DIAGNOSIS — D573 Sickle-cell trait: Secondary | ICD-10-CM

## 2021-01-01 ENCOUNTER — Telehealth (INDEPENDENT_AMBULATORY_CARE_PROVIDER_SITE_OTHER): Payer: BC Managed Care – PPO | Admitting: Obstetrics and Gynecology

## 2021-01-01 ENCOUNTER — Other Ambulatory Visit: Payer: Self-pay

## 2021-01-01 ENCOUNTER — Encounter: Payer: Self-pay | Admitting: Obstetrics and Gynecology

## 2021-01-01 VITALS — BP 111/77 | HR 103 | Wt 151.0 lb

## 2021-01-01 DIAGNOSIS — Z3A20 20 weeks gestation of pregnancy: Secondary | ICD-10-CM

## 2021-01-01 DIAGNOSIS — Z3400 Encounter for supervision of normal first pregnancy, unspecified trimester: Secondary | ICD-10-CM

## 2021-01-01 DIAGNOSIS — O99012 Anemia complicating pregnancy, second trimester: Secondary | ICD-10-CM

## 2021-01-01 DIAGNOSIS — D573 Sickle-cell trait: Secondary | ICD-10-CM

## 2021-01-01 DIAGNOSIS — O210 Mild hyperemesis gravidarum: Secondary | ICD-10-CM

## 2021-01-01 NOTE — Progress Notes (Signed)
MY CHART VIDEO VIRTUAL OBSTETRICS VISIT ENCOUNTER NOTE  I connected with Leah Vargas on 01/01/21 at  1:35 PM EST by My Chart video at home and verified that I am speaking with the correct person using two identifiers. Provider located at [employer] for [employer] at [place].   I discussed the limitations, risks, security and privacy concerns of performing an evaluation and management service by My Chart video and the availability of in person appointments. I also discussed with the patient that there may be a patient responsible charge related to this service. The patient expressed understanding and agreed to proceed.  I discussed the limitations of telemedicine and the availability of in person appointments.  Discussed there is a possibility of technology failure and discussed alternative modes of communication if that failure occurs.  Subjective:  Leah Vargas is a 30 y.o. G1P0 at [redacted]w[redacted]d being followed for ongoing prenatal care.  She is currently monitored for the following issues for this low-risk pregnancy and has Hyperemesis; Elevated lipase; Hypokalemia; [redacted] weeks gestation of pregnancy; Encounter for supervision of normal first pregnancy; and Anemia in pregnancy on their problem list.  Patient reports nausea and vomiting. Reports fetal movement. Denies any contractions, bleeding or leaking of fluid.   The following portions of the patient's history were reviewed and updated as appropriate: allergies, current medications, past family history, past medical history, past social history, past surgical history and problem list.   Objective:   General:  Alert, oriented and cooperative.   Mental Status: Normal mood and affect perceived. Normal judgment and thought content.  Rest of physical exam deferred due to type of encounter  BP 111/77   Pulse (!) 103   Wt 151 lb (68.5 kg)   LMP 08/12/2020 (Exact Date)   BMI 25.92 kg/m  **Done by patient's own at home BP cuff  and scale  Assessment and Plan:  Pregnancy: G1P0 at [redacted]w[redacted]d  1. Supervision of normal first pregnancy, antepartum - 2. Anemia during pregnancy in second trimester - Rx'd Niferex  3. Sickle-cell trait (HCC) - Had amniocentesis with MFM - Reviewed results as in computer - Advised to speak to MFM at next appt on 01/09/21 - Needs UCs every trimester>>none seen for 1st trimester  plan UCx at next visit on 01/29/21  4. Hyperemesis affecting pregnancy, antepartum - Advised to take Zofran she has at home for N/V  5. [redacted] weeks gestation of pregnancy   Preterm labor symptoms and general obstetric precautions including but not limited to vaginal bleeding, contractions, leaking of fluid and fetal movement were reviewed in detail with the patient.  I discussed the assessment and treatment plan with the patient. The patient was provided an opportunity to ask questions and all were answered. The patient agreed with the plan and demonstrated an understanding of the instructions. The patient was advised to call back or seek an in-person office evaluation/go to [facility] at [facility] for any urgent or concerning symptoms. Please refer to After Visit Summary for other counseling recommendations.   I provided 5 minutes of non-face-to-face time during this encounter. There was 5 minutes of chart review time spent prior to this encounter. Total time spent = 10 minutes.  No follow-ups on file.  Future Appointments  Date Time Provider Department Center  01/09/2021  1:00 PM [facility] NURSE [facility]  01/09/2021  1:15 PM [facility] US2 [facility]  01/29/2021  1:55 PM Raelyn Mora, CNM [facility] None    Raelyn Mora, [facility] for [employer]  [facility], [facility]

## 2021-01-09 ENCOUNTER — Encounter: Payer: Self-pay | Admitting: *Deleted

## 2021-01-09 ENCOUNTER — Ambulatory Visit: Payer: BC Managed Care – PPO | Attending: Obstetrics and Gynecology

## 2021-01-09 ENCOUNTER — Other Ambulatory Visit: Payer: Self-pay

## 2021-01-09 ENCOUNTER — Other Ambulatory Visit: Payer: Self-pay | Admitting: *Deleted

## 2021-01-09 ENCOUNTER — Ambulatory Visit: Payer: BC Managed Care – PPO | Admitting: *Deleted

## 2021-01-09 VITALS — BP 107/57 | HR 88

## 2021-01-09 DIAGNOSIS — O99019 Anemia complicating pregnancy, unspecified trimester: Secondary | ICD-10-CM | POA: Insufficient documentation

## 2021-01-09 DIAGNOSIS — O99012 Anemia complicating pregnancy, second trimester: Secondary | ICD-10-CM | POA: Insufficient documentation

## 2021-01-09 DIAGNOSIS — R748 Abnormal levels of other serum enzymes: Secondary | ICD-10-CM

## 2021-01-09 DIAGNOSIS — Z3402 Encounter for supervision of normal first pregnancy, second trimester: Secondary | ICD-10-CM | POA: Insufficient documentation

## 2021-01-09 DIAGNOSIS — O283 Abnormal ultrasonic finding on antenatal screening of mother: Secondary | ICD-10-CM

## 2021-01-09 DIAGNOSIS — D573 Sickle-cell trait: Secondary | ICD-10-CM | POA: Diagnosis not present

## 2021-01-09 DIAGNOSIS — Z3A22 22 weeks gestation of pregnancy: Secondary | ICD-10-CM | POA: Diagnosis not present

## 2021-01-29 ENCOUNTER — Other Ambulatory Visit: Payer: Self-pay

## 2021-01-29 ENCOUNTER — Ambulatory Visit (INDEPENDENT_AMBULATORY_CARE_PROVIDER_SITE_OTHER): Payer: BC Managed Care – PPO | Admitting: Obstetrics and Gynecology

## 2021-01-29 VITALS — BP 104/66 | HR 96 | Temp 98.1°F | Wt 160.2 lb

## 2021-01-29 DIAGNOSIS — D573 Sickle-cell trait: Secondary | ICD-10-CM

## 2021-01-29 DIAGNOSIS — Z3A24 24 weeks gestation of pregnancy: Secondary | ICD-10-CM

## 2021-01-29 DIAGNOSIS — O210 Mild hyperemesis gravidarum: Secondary | ICD-10-CM

## 2021-01-29 DIAGNOSIS — Z3400 Encounter for supervision of normal first pregnancy, unspecified trimester: Secondary | ICD-10-CM

## 2021-01-29 MED ORDER — ONDANSETRON 8 MG PO TBDP: 8.0000 mg | ORAL_TABLET | Freq: Three times a day (TID) | ORAL | 2 refills | Status: DC | PRN

## 2021-01-29 NOTE — Progress Notes (Signed)
° °  LOW-RISK PREGNANCY OFFICE VISIT Patient name: Leah Vargas MRN [id number]  Date of birth: 1991-01-03 Chief Complaint:   Routine Prenatal Visit  History of Present Illness:   Leah Vargas is a 30 y.o. G1P0 female at [redacted]w[redacted]d with an Estimated Date of Delivery: 05/15/21 being seen today for ongoing management of a low-risk pregnancy.  Today she reports no complaints. Contractions: Not present. Vag. Bleeding: None.  Movement: Present. denies leaking of fluid. Review of Systems:   Pertinent items are noted in HPI Denies abnormal vaginal discharge w/ itching/odor/irritation, headaches, visual changes, shortness of breath, chest pain, abdominal pain, severe nausea/vomiting, or problems with urination or bowel movements unless otherwise stated above. Pertinent History Reviewed:  Reviewed past medical,surgical, social, obstetrical and family history.  Reviewed problem list, medications and allergies. Physical Assessment:   Vitals:   01/29/21 1342  BP: 104/66  Pulse: 96  Temp: 98.1 F (36.7 C)  Weight: 160 lb 3.2 oz (72.7 kg)  Body mass index is 27.5 kg/m.        Physical Examination:   General appearance: Well appearing, and in no distress  Mental status: Alert, oriented to person, place, and time  Skin: Warm & dry  Cardiovascular: Normal heart rate noted  Respiratory: Normal respiratory effort, no distress  Abdomen: Soft, gravid, nontender  Pelvic: Cervical exam deferred         Extremities: Edema: None  Fetal Status: Fetal Heart Rate (bpm): 140 Fundal Height: 25 cm Movement: Present    No results found for this or any previous visit (from the past 24 hour(s)).  Assessment & Plan:  1) Low-risk pregnancy G1P0 at [redacted]w[redacted]d with an Estimated Date of Delivery: 05/15/21   2) Supervision of normal first pregnancy, antepartum - Plan: Culture, OB Urine  3) Sickle-cell trait (HCC)  - Culture, OB Urine  4) Hyperemesis affecting pregnancy, antepartum  - Rx for ondansetron  (ZOFRAN ODT) 8 MG disintegrating tablet  5) [redacted] weeks gestation of pregnancy    Meds:  Meds ordered this encounter  Medications   ondansetron (ZOFRAN ODT) 8 MG disintegrating tablet    Sig: Take 1 tablet (8 mg total) by mouth every 8 (eight) hours as needed for nausea or vomiting.    Dispense:  60 tablet    Refill:  2   Labs/procedures today: UCx  Plan:  Continue routine obstetrical care   Reviewed: Preterm labor symptoms and general obstetric precautions including but not limited to vaginal bleeding, contractions, leaking of fluid and fetal movement were reviewed in detail with the patient.  All questions were answered. Has home bp cuff. Check bp weekly, let us know if >140/90.   Follow-up: Return in about 4 weeks (around 02/26/2021) for Return OB 2hr GTT.  Orders Placed This Encounter  Procedures   Culture, OB Urine   Raelyn Mora MSN, CNM 01/29/2021 4:37 PM

## 2021-01-29 NOTE — Patient Instructions (Signed)
Oral Glucose Tolerance Test During Pregnancy °Why am I having this test? °The oral glucose tolerance test (OGTT) is done to check how your body processes blood sugar (glucose). This is one of several tests used to diagnose diabetes that develops during pregnancy (gestational diabetes mellitus). Gestational diabetes is a short-term form of diabetes that some women develop while they are pregnant. It usually occurs during the second trimester of pregnancy and goes away after delivery. °Testing, or screening, for gestational diabetes usually occurs at weeks 24-28 of pregnancy. You may have the OGTT test after having a 1-hour glucose screening test if the results from that test indicate that you may have gestational diabetes. This test may also be needed if: °You have a history of gestational diabetes. °There is a history of giving birth to very large babies or of losing pregnancies (having stillbirths). °You have signs and symptoms of diabetes, such as: °Changes in your eyesight. °Tingling or numbness in your hands or feet. °Changes in hunger, thirst, and urination, and these are not explained by your pregnancy. °What is being tested? °This test measures the amount of glucose in your blood at different times during a period of 3 hours. This shows how well your body can process glucose. °What kind of sample is taken? °Blood samples are required for this test. They are usually collected by inserting a needle into a blood vessel. °How do I prepare for this test? °For 3 days before your test, eat normally. Have plenty of carbohydrate-rich foods. °Follow instructions from your health care provider about: °Eating or drinking restrictions on the day of the test. You may be asked not to eat or drink anything other than water (to fast) starting 8-10 hours before the test. °Changing or stopping your regular medicines. Some medicines may interfere with this test. °Tell a health care provider about: °All medicines you are taking,  including vitamins, herbs, eye drops, creams, and over-the-counter medicines. °Any blood disorders you have. °Any surgeries you have had. °Any medical conditions you have. °What happens during the test? °First, your blood glucose will be measured. This is referred to as your fasting blood glucose because you fasted before the test. Then, you will drink a glucose solution that contains a certain amount of glucose. Your blood glucose will be measured again 1, 2, and 3 hours after you drink the solution. °This test takes about 3 hours to complete. You will need to stay at the testing location during this time. During the testing period: °Do not eat or drink anything other than the glucose solution. °Do not exercise. °Do not use any products that contain nicotine or tobacco, such as cigarettes, e-cigarettes, and chewing tobacco. These can affect your test results. If you need help quitting, ask your health care provider. °The testing procedure may vary among health care providers and hospitals. °How are the results reported? °Your results will be reported as milligrams of glucose per deciliter of blood (mg/dL) or millimoles per liter (mmol/L). There is more than one source for screening and diagnosis reference values used to diagnose gestational diabetes. Your health care provider will compare your results to normal values that were established after testing a large group of people (reference values). Reference values may vary among labs and hospitals. For this test (Carpenter-Coustan), reference values are: °Fasting: 95 mg/dL (5.3 mmol/L). °1 hour: 180 mg/dL (10.0 mmol/L). °2 hour: 155 mg/dL (8.6 mmol/L). °3 hour: 140 mg/dL (7.8 mmol/L). °What do the results mean? °Results below the reference values are considered   normal. If two or more of your blood glucose levels are at or above the reference values, you may be diagnosed with gestational diabetes. If only one level is high, your health care provider may suggest  repeat testing or other tests to confirm a diagnosis. °Talk with your health care provider about what your results mean. °Questions to ask your health care provider °Ask your health care provider, or the department that is doing the test: °When will my results be ready? °How will I get my results? °What are my treatment options? °What other tests do I need? °What are my next steps? °Summary °The oral glucose tolerance test (OGTT) is one of several tests used to diagnose diabetes that develops during pregnancy (gestational diabetes mellitus). Gestational diabetes is a short-term form of diabetes that some women develop while they are pregnant. °You may have the OGTT test after having a 1-hour glucose screening test if the results from that test show that you may have gestational diabetes. You may also have this test if you have any symptoms or risk factors for this type of diabetes. °Talk with your health care provider about what your results mean. °This information is not intended to replace advice given to you by your health care provider. Make sure you discuss any questions you have with your health care provider. °Document Revised: 06/28/2019 Document Reviewed: 06/28/2019 °Elsevier Patient Education © 2022 Elsevier Inc. ° °

## 2021-01-31 LAB — URINE CULTURE, OB REFLEX

## 2021-01-31 LAB — CULTURE, OB URINE

## 2021-02-01 NOTE — L&D Delivery Note (Signed)
OB/GYN Faculty Practice Delivery Note ? ?Leah Vargas is a 31 y.o. G1P0 s/p SVD at [redacted]w[redacted]d. She was admitted for IOL for decreased fetal movement at term.  ? ?ROM: 7h 45m with clear fluid ?GBS Status: negative ?Maximum Maternal Temperature: 100.2 ? ?Labor Progress: ?Presented for IOL and received cytotec x2 and had a foley balloon placed. She then was AROMed once her foley balloon was out and also started on pitocin and she progressed to complete. ? ?Delivery Date/Time: 0500 on  05/13/2021 ?Delivery: Called to room and patient was complete and pushing. Head delivered LOA. No nuchal cord present. Shoulder and body delivered in usual fashion. Infant initially appeared stunned and did not cry spontaneously and therefore the cord clamped x 2 cut immediately and she was taken to the warmer. Ultimately she was stimulated and responded and brought back to patient for skin to skin. Cord blood drawn. Placenta delivered spontaneously with gentle cord traction. Fundus firm with massage and Pitocin. Labia, perineum, vagina, and cervix inspected and found to have a second degree perineal laceration that was repaired with 3-0 vicryl.  ? ?Placenta: Intact, 3V cord. Somewhat adherent and lots of calcifications noted. Sent to path ?Complications: none ?Lacerations: 2nd degree  ?EBL: 300cc ?Analgesia: epidural ? ?Infant: female  APGARs 64,8  3220g ? ?Warner Mccreedy, MD, MPH ?OB Fellow, Faculty Practice ?[facility] for [employer], [facility] ?05/13/2021, 5:57 AM ? ? ? ? ? ?

## 2021-02-06 ENCOUNTER — Ambulatory Visit: Payer: BC Managed Care – PPO | Admitting: *Deleted

## 2021-02-06 ENCOUNTER — Other Ambulatory Visit: Payer: Self-pay

## 2021-02-06 ENCOUNTER — Ambulatory Visit: Payer: BC Managed Care – PPO | Attending: Obstetrics and Gynecology

## 2021-02-06 VITALS — BP 108/56 | HR 86

## 2021-02-06 DIAGNOSIS — Z3402 Encounter for supervision of normal first pregnancy, second trimester: Secondary | ICD-10-CM | POA: Insufficient documentation

## 2021-02-06 DIAGNOSIS — R748 Abnormal levels of other serum enzymes: Secondary | ICD-10-CM | POA: Diagnosis present

## 2021-02-06 DIAGNOSIS — O99012 Anemia complicating pregnancy, second trimester: Secondary | ICD-10-CM | POA: Insufficient documentation

## 2021-02-06 DIAGNOSIS — O283 Abnormal ultrasonic finding on antenatal screening of mother: Secondary | ICD-10-CM | POA: Insufficient documentation

## 2021-02-06 DIAGNOSIS — Z3A26 26 weeks gestation of pregnancy: Secondary | ICD-10-CM | POA: Diagnosis not present

## 2021-02-09 ENCOUNTER — Other Ambulatory Visit: Payer: Self-pay | Admitting: *Deleted

## 2021-02-09 DIAGNOSIS — D573 Sickle-cell trait: Secondary | ICD-10-CM

## 2021-02-09 DIAGNOSIS — O99019 Anemia complicating pregnancy, unspecified trimester: Secondary | ICD-10-CM

## 2021-02-27 ENCOUNTER — Ambulatory Visit (INDEPENDENT_AMBULATORY_CARE_PROVIDER_SITE_OTHER): Payer: BC Managed Care – PPO

## 2021-02-27 ENCOUNTER — Other Ambulatory Visit: Payer: Self-pay

## 2021-02-27 VITALS — BP 102/59 | HR 92 | Temp 97.4°F | Wt 164.6 lb

## 2021-02-27 DIAGNOSIS — O99891 Other specified diseases and conditions complicating pregnancy: Secondary | ICD-10-CM

## 2021-02-27 DIAGNOSIS — M549 Dorsalgia, unspecified: Secondary | ICD-10-CM

## 2021-02-27 DIAGNOSIS — D573 Sickle-cell trait: Secondary | ICD-10-CM

## 2021-02-27 DIAGNOSIS — Z3A29 29 weeks gestation of pregnancy: Secondary | ICD-10-CM

## 2021-02-27 DIAGNOSIS — Z3400 Encounter for supervision of normal first pregnancy, unspecified trimester: Secondary | ICD-10-CM

## 2021-02-27 NOTE — Progress Notes (Signed)
° °  PRENATAL VISIT NOTE  Subjective:  Leah Vargas is a 31 y.o. G1P0 at [redacted]w[redacted]d being seen today for ongoing prenatal care.  She is currently monitored for the following issues for this low-risk pregnancy and has Hyperemesis; Elevated lipase; Hypokalemia; [redacted] weeks gestation of pregnancy; Encounter for supervision of normal first pregnancy; Anemia in pregnancy; and Sickle-cell trait (HCC) on their problem list.  Patient reports upper and lower back pain x1 week. Intermittent. Has not tried anything to relieve pain. Contractions: Not present. Vag. Bleeding: None.  Movement: Present. Denies leaking of fluid.   The following portions of the patient's history were reviewed and updated as appropriate: allergies, current medications, past family history, past medical history, past social history, past surgical history and problem list.   Objective:   Vitals:   02/27/21 0835  BP: (!) 102/59  Pulse: 92  Temp: (!) 97.4 F (36.3 C)  Weight: 164 lb 9.6 oz (74.7 kg)    Fetal Status: Fetal Heart Rate (bpm): 156 Fundal Height: 29 cm Movement: Present     General:  Alert, oriented and cooperative. Patient is in no acute distress.  Skin: Skin is warm and dry. No rash noted.   Cardiovascular: Normal heart rate noted  Respiratory: Normal respiratory effort, no problems with respiration noted  Abdomen: Soft, gravid, appropriate for gestational age.  Pain/Pressure: Present     Pelvic: Cervical exam deferred        Extremities: Normal range of motion.  Edema: None  Mental Status: Normal mood and affect. Normal behavior. Normal judgment and thought content.   Assessment and Plan:  Pregnancy: G1P0 at [redacted]w[redacted]d 1. Supervision of normal first pregnancy, antepartum - Routine OB. Doing well. - GTT and labs today. - Endorses active fetal movement. - Anticipatory guidance for upcoming appointments provided.   - Glucose Tolerance, 2 Hours w/1 Hour - HIV Antibody (routine testing w rflx) - RPR -  CBC  2. [redacted] weeks gestation of pregnancy   3. Sickle-cell trait (HCC)   4. Back pain affecting pregnancy in third trimester - Reassurance provided. Support belt, heat/ice, stretching, hydration. Tylenol prn. May also look into massage therapy or chiropractic care.    Preterm labor symptoms and general obstetric precautions including but not limited to vaginal bleeding, contractions, leaking of fluid and fetal movement were reviewed in detail with the patient. Please refer to After Visit Summary for other counseling recommendations.   Return in about 4 weeks (around 03/27/2021).  Future Appointments  Date Time Provider Department Center  03/06/2021  3:15 PM [facility] NURSE [facility]  03/06/2021  3:30 PM [facility] US3 [facility]  04/01/2021  1:35 PM Bernerd Limbo, CNM [facility] None  04/22/2021  1:35 PM Raelyn Mora, CNM [facility] None    Brand Males, CNM

## 2021-02-28 LAB — CBC
Hematocrit: 26.9 % — ABNORMAL LOW (ref 34.0–46.6)
Hemoglobin: 8.2 g/dL — ABNORMAL LOW (ref 11.1–15.9)
MCH: 21.9 pg — ABNORMAL LOW (ref 26.6–33.0)
MCHC: 30.5 g/dL — ABNORMAL LOW (ref 31.5–35.7)
MCV: 72 fL — ABNORMAL LOW (ref 79–97)
Platelets: 317 10*3/uL (ref 150–450)
RBC: 3.75 x10E6/uL — ABNORMAL LOW (ref 3.77–5.28)
RDW: 17.6 % — ABNORMAL HIGH (ref 11.7–15.4)
WBC: 7.4 10*3/uL (ref 3.4–10.8)

## 2021-02-28 LAB — GLUCOSE TOLERANCE, 2 HOURS W/ 1HR
Glucose, 1 hour: 109 mg/dL (ref 70–179)
Glucose, 2 hour: 116 mg/dL (ref 70–152)
Glucose, Fasting: 71 mg/dL (ref 70–91)

## 2021-02-28 LAB — RPR: RPR Ser Ql: NONREACTIVE

## 2021-02-28 LAB — HIV ANTIBODY (ROUTINE TESTING W REFLEX): HIV Screen 4th Generation wRfx: NONREACTIVE

## 2021-03-06 ENCOUNTER — Other Ambulatory Visit: Payer: Self-pay

## 2021-03-06 ENCOUNTER — Ambulatory Visit: Payer: BC Managed Care – PPO | Attending: Obstetrics and Gynecology

## 2021-03-06 ENCOUNTER — Ambulatory Visit: Payer: BC Managed Care – PPO | Admitting: *Deleted

## 2021-03-06 VITALS — BP 111/67 | HR 90

## 2021-03-06 DIAGNOSIS — D573 Sickle-cell trait: Secondary | ICD-10-CM | POA: Insufficient documentation

## 2021-03-06 DIAGNOSIS — Z3A30 30 weeks gestation of pregnancy: Secondary | ICD-10-CM | POA: Insufficient documentation

## 2021-03-06 DIAGNOSIS — O99013 Anemia complicating pregnancy, third trimester: Secondary | ICD-10-CM | POA: Insufficient documentation

## 2021-03-06 DIAGNOSIS — O99019 Anemia complicating pregnancy, unspecified trimester: Secondary | ICD-10-CM | POA: Insufficient documentation

## 2021-03-06 DIAGNOSIS — O365930 Maternal care for other known or suspected poor fetal growth, third trimester, not applicable or unspecified: Secondary | ICD-10-CM | POA: Diagnosis not present

## 2021-03-06 DIAGNOSIS — Z3403 Encounter for supervision of normal first pregnancy, third trimester: Secondary | ICD-10-CM

## 2021-03-09 ENCOUNTER — Other Ambulatory Visit: Payer: Self-pay | Admitting: *Deleted

## 2021-03-09 DIAGNOSIS — D571 Sickle-cell disease without crisis: Secondary | ICD-10-CM

## 2021-03-18 ENCOUNTER — Other Ambulatory Visit: Payer: Self-pay | Admitting: *Deleted

## 2021-03-18 NOTE — Progress Notes (Signed)
Venofer 500 mg x 1 dose ordered, per Edd Arbour, CNM ok to order that dosage. April 06, 2021 at 9 AM.  Clovis Pu, RN

## 2021-04-01 ENCOUNTER — Ambulatory Visit (INDEPENDENT_AMBULATORY_CARE_PROVIDER_SITE_OTHER): Payer: BC Managed Care – PPO | Admitting: Certified Nurse Midwife

## 2021-04-01 ENCOUNTER — Other Ambulatory Visit: Payer: Self-pay

## 2021-04-01 VITALS — BP 103/67 | HR 92 | Wt 167.8 lb

## 2021-04-01 DIAGNOSIS — Z3403 Encounter for supervision of normal first pregnancy, third trimester: Secondary | ICD-10-CM

## 2021-04-01 DIAGNOSIS — Z3A33 33 weeks gestation of pregnancy: Secondary | ICD-10-CM

## 2021-04-01 NOTE — Progress Notes (Signed)
? ?  PRENATAL VISIT NOTE ? ?Subjective:  ?Leah Vargas is a 31 y.o. G1P0 at [redacted]w[redacted]d being seen today for ongoing prenatal care.  She is currently monitored for the following issues for this low-risk pregnancy and has Hyperemesis; Elevated lipase; Hypokalemia; [redacted] weeks gestation of pregnancy; Encounter for supervision of normal first pregnancy; Anemia in pregnancy; and Sickle-cell trait (HCC) on their problem list. ? ?Patient reports no complaints, had questions about why we have not scheduled an IOL or CS yet as she is on a mother's group online and several of the women in her due date group already have these scheduled. Contractions: Irritability. Vag. Bleeding: None.  Movement: Present. Denies leaking of fluid.  ? ?The following portions of the patient's history were reviewed and updated as appropriate: allergies, current medications, past family history, past medical history, past social history, past surgical history and problem list.  ? ?Objective:  ? ?Vitals:  ? 04/01/21 1408  ?BP: 103/67  ?Pulse: 92  ?Weight: 167 lb 12.8 oz (76.1 kg)  ? ?Fetal Status: Fetal Heart Rate (bpm): 152 Fundal Height: 34 cm Movement: Present  Presentation: Vertex ? ?General:  Alert, oriented and cooperative. Patient is in no acute distress.  ?Skin: Skin is warm and dry. No rash noted.   ?Cardiovascular: Normal heart rate noted  ?Respiratory: Normal respiratory effort, no problems with respiration noted  ?Abdomen: Soft, gravid, appropriate for gestational age.  Pain/Pressure: Present     ?Pelvic: Cervical exam deferred        ?Extremities: Normal range of motion.  Edema: None  ?Mental Status: Normal mood and affect. Normal behavior. Normal judgment and thought content.  ? ?Assessment and Plan:  ?Pregnancy: G1P0 at [redacted]w[redacted]d ?1. Encounter for supervision of low-risk first pregnancy in third trimester ?- Doing well, feeling regular and vigorous fetal movement  ? ?2. [redacted] weeks gestation of pregnancy ?- Routine OB care  ?- Reviewed our  guidelines for scheduling IOL (at 41wks unless other complications arise) and use of CS only when medically indicated and all other options have been exhausted. Reassured pt that this is evidence-based practice and to beware that her online friends may have diagnoses they are not mentioning or MDs who do not use EBP as guidelines. She expressed reassurance. ? ?Preterm labor symptoms and general obstetric precautions including but not limited to vaginal bleeding, contractions, leaking of fluid and fetal movement were reviewed in detail with the patient. ?Please refer to After Visit Summary for other counseling recommendations.  ? ?Return in about 2 weeks (around 04/15/2021) for IN-PERSON, LOB. ? ?Future Appointments  ?Date Time Provider Department Center  ?04/03/2021  1:15 PM [facility] NURSE [facility]  ?04/03/2021  1:30 PM [facility] US3 [facility]  ?04/06/2021  9:00 AM [facility] None  ?04/20/2021 11:15 AM Warden Fillers, MD [facility]  ? ?Bernerd Limbo, CNM ?

## 2021-04-03 ENCOUNTER — Other Ambulatory Visit: Payer: Self-pay

## 2021-04-03 ENCOUNTER — Ambulatory Visit: Payer: BC Managed Care – PPO | Attending: Maternal & Fetal Medicine

## 2021-04-03 ENCOUNTER — Ambulatory Visit: Payer: BC Managed Care – PPO | Admitting: *Deleted

## 2021-04-03 VITALS — BP 108/59 | HR 84

## 2021-04-03 DIAGNOSIS — Z3A34 34 weeks gestation of pregnancy: Secondary | ICD-10-CM | POA: Diagnosis not present

## 2021-04-03 DIAGNOSIS — O359XX0 Maternal care for (suspected) fetal abnormality and damage, unspecified, not applicable or unspecified: Secondary | ICD-10-CM | POA: Diagnosis not present

## 2021-04-03 DIAGNOSIS — Z148 Genetic carrier of other disease: Secondary | ICD-10-CM | POA: Insufficient documentation

## 2021-04-03 DIAGNOSIS — Z362 Encounter for other antenatal screening follow-up: Secondary | ICD-10-CM | POA: Diagnosis not present

## 2021-04-03 DIAGNOSIS — O358XX0 Maternal care for other (suspected) fetal abnormality and damage, not applicable or unspecified: Secondary | ICD-10-CM | POA: Insufficient documentation

## 2021-04-03 DIAGNOSIS — D571 Sickle-cell disease without crisis: Secondary | ICD-10-CM

## 2021-04-03 DIAGNOSIS — Z3403 Encounter for supervision of normal first pregnancy, third trimester: Secondary | ICD-10-CM

## 2021-04-03 DIAGNOSIS — O99013 Anemia complicating pregnancy, third trimester: Secondary | ICD-10-CM

## 2021-04-06 ENCOUNTER — Other Ambulatory Visit: Payer: Self-pay

## 2021-04-06 ENCOUNTER — Encounter (HOSPITAL_COMMUNITY)
Admission: RE | Admit: 2021-04-06 | Discharge: 2021-04-06 | Disposition: A | Discharge status: Home / Self Care | Payer: BC Managed Care – PPO | Source: Ambulatory Visit | Attending: Family Medicine | Admitting: Family Medicine

## 2021-04-06 DIAGNOSIS — Z3403 Encounter for supervision of normal first pregnancy, third trimester: Secondary | ICD-10-CM | POA: Insufficient documentation

## 2021-04-06 DIAGNOSIS — Z3A00 Weeks of gestation of pregnancy not specified: Secondary | ICD-10-CM | POA: Insufficient documentation

## 2021-04-06 MED ORDER — DIPHENHYDRAMINE HCL 50 MG/ML IJ SOLN: 25.0000 mg | Freq: Once | INTRAMUSCULAR | Status: DC | PRN

## 2021-04-06 MED ORDER — ALBUTEROL SULFATE (2.5 MG/3ML) 0.083% IN NEBU: 2.5000 mg | INHALATION_SOLUTION | Freq: Once | RESPIRATORY_TRACT | Status: DC | PRN

## 2021-04-06 MED ORDER — SODIUM CHLORIDE 0.9 % IV SOLN
INTRAVENOUS | Status: DC | PRN
Start: 2021-04-06 — End: 2021-04-07

## 2021-04-06 MED ORDER — METHYLPREDNISOLONE SODIUM SUCC 125 MG IJ SOLR: 125.0000 mg | Freq: Once | INTRAMUSCULAR | Status: DC | PRN

## 2021-04-06 MED ORDER — EPINEPHRINE PF 1 MG/ML IJ SOLN: 0.3000 mg | Freq: Once | INTRAMUSCULAR | Status: DC | PRN

## 2021-04-06 MED ORDER — SODIUM CHLORIDE 0.9 % IV BOLUS: 500.0000 mL | Freq: Once | INTRAVENOUS | Status: DC | PRN

## 2021-04-06 MED ORDER — SODIUM CHLORIDE 0.9 % IV SOLN
500.0000 mg | Freq: Once | INTRAVENOUS | Status: AC
  Administered 2021-04-06: 500 mg via INTRAVENOUS
  Filled 2021-04-06: qty 25

## 2021-04-20 ENCOUNTER — Other Ambulatory Visit (HOSPITAL_COMMUNITY)
Admission: RE | Admit: 2021-04-20 | Discharge: 2021-04-20 | Disposition: A | Discharge status: Home / Self Care | Payer: BC Managed Care – PPO | Source: Ambulatory Visit | Attending: Obstetrics and Gynecology | Admitting: Obstetrics and Gynecology

## 2021-04-20 ENCOUNTER — Ambulatory Visit (INDEPENDENT_AMBULATORY_CARE_PROVIDER_SITE_OTHER): Payer: BC Managed Care – PPO | Admitting: Obstetrics and Gynecology

## 2021-04-20 ENCOUNTER — Other Ambulatory Visit: Payer: Self-pay

## 2021-04-20 VITALS — BP 118/71 | HR 87 | Wt 173.0 lb

## 2021-04-20 DIAGNOSIS — O99013 Anemia complicating pregnancy, third trimester: Secondary | ICD-10-CM

## 2021-04-20 DIAGNOSIS — N760 Acute vaginitis: Secondary | ICD-10-CM | POA: Insufficient documentation

## 2021-04-20 DIAGNOSIS — Z3A36 36 weeks gestation of pregnancy: Secondary | ICD-10-CM

## 2021-04-20 DIAGNOSIS — O26893 Other specified pregnancy related conditions, third trimester: Secondary | ICD-10-CM | POA: Insufficient documentation

## 2021-04-20 DIAGNOSIS — Z3403 Encounter for supervision of normal first pregnancy, third trimester: Secondary | ICD-10-CM | POA: Insufficient documentation

## 2021-04-20 DIAGNOSIS — B3731 Acute candidiasis of vulva and vagina: Secondary | ICD-10-CM | POA: Insufficient documentation

## 2021-04-20 DIAGNOSIS — Z3A00 Weeks of gestation of pregnancy not specified: Secondary | ICD-10-CM | POA: Insufficient documentation

## 2021-04-20 DIAGNOSIS — D573 Sickle-cell trait: Secondary | ICD-10-CM

## 2021-04-20 DIAGNOSIS — N898 Other specified noninflammatory disorders of vagina: Secondary | ICD-10-CM | POA: Insufficient documentation

## 2021-04-20 NOTE — Progress Notes (Signed)
? ?  PRENATAL VISIT NOTE ? ?Subjective:  ?Oluwatamilore Duncan Dull is a 31 y.o. G1P0 at [redacted]w[redacted]d being seen today for ongoing prenatal care.  She is currently monitored for the following issues for this low-risk pregnancy and has Hyperemesis; Elevated lipase; Hypokalemia; [redacted] weeks gestation of pregnancy; Encounter for supervision of normal first pregnancy; Anemia in pregnancy; and Sickle-cell trait (HCC) on their problem list. ? ?Patient doing well with no acute concerns today. She reports no complaints.  Contractions: Irritability. Vag. Bleeding: None.  Movement: Present. Denies leaking of fluid.  ? ?The following portions of the patient's history were reviewed and updated as appropriate: allergies, current medications, past family history, past medical history, past social history, past surgical history and problem list. Problem list updated. ? ?Objective:  ? ?Vitals:  ? 04/20/21 1147  ?BP: 118/71  ?Pulse: 87  ?Weight: 173 lb (78.5 kg)  ? ? ?Fetal Status: Fetal Heart Rate (bpm): 152 Fundal Height: 37 cm Movement: Present    ? ?General:  Alert, oriented and cooperative. Patient is in no acute distress.  ?Skin: Skin is warm and dry. No rash noted.   ?Cardiovascular: Normal heart rate noted  ?Respiratory: Normal respiratory effort, no problems with respiration noted  ?Abdomen: Soft, gravid, appropriate for gestational age.  Pain/Pressure: Present     ?Pelvic: Cervical exam deferred Dilation: Closed Effacement (%): 40 Station: -3  ?Extremities: Normal range of motion.  Edema: None  ?Mental Status:  Normal mood and affect. Normal behavior. Normal judgment and thought content.  ? ?Assessment and Plan:  ?Pregnancy: G1P0 at [redacted]w[redacted]d ? ?1. [redacted] weeks gestation of pregnancy ? ? ?2. Sickle-cell trait (HCC) ? ? ?3. Encounter for supervision of normal first pregnancy in third trimester ?Continue routine care ? ?- Culture, beta strep (group b only) ?- Cervicovaginal ancillary only( [place]) ? ?4. Anemia during pregnancy in third  trimester ?Last h/h 8.2/26.9 ? ?5. Discharge from the vagina ? ?- Cervicovaginal ancillary only( [place]) ? ?Preterm labor symptoms and general obstetric precautions including but not limited to vaginal bleeding, contractions, leaking of fluid and fetal movement were reviewed in detail with the patient. ? ?Please refer to After Visit Summary for other counseling recommendations.  ? ?Return in about 1 week (around 04/27/2021) for in person, LOB. ? ? ?Mariel Aloe, MD ?Faculty Attending ?[facility] ?  ?

## 2021-04-21 LAB — CERVICOVAGINAL ANCILLARY ONLY
Bacterial Vaginitis (gardnerella): POSITIVE — AB
Candida Glabrata: NEGATIVE
Candida Vaginitis: POSITIVE — AB
Chlamydia: NEGATIVE
Comment: NEGATIVE
Comment: NEGATIVE
Comment: NEGATIVE
Comment: NEGATIVE
Comment: NEGATIVE
Comment: NORMAL
Neisseria Gonorrhea: NEGATIVE
Trichomonas: NEGATIVE

## 2021-04-22 ENCOUNTER — Encounter: Payer: BC Managed Care – PPO | Admitting: Obstetrics and Gynecology

## 2021-04-23 ENCOUNTER — Encounter: Payer: Self-pay | Admitting: General Practice

## 2021-04-23 DIAGNOSIS — B9689 Other specified bacterial agents as the cause of diseases classified elsewhere: Secondary | ICD-10-CM

## 2021-04-23 DIAGNOSIS — B379 Candidiasis, unspecified: Secondary | ICD-10-CM

## 2021-04-23 MED ORDER — FLUCONAZOLE 150 MG PO TABS: 150.0000 mg | ORAL_TABLET | Freq: Once | ORAL | 0 refills | Status: AC

## 2021-04-23 MED ORDER — METRONIDAZOLE 500 MG PO TABS: 500.0000 mg | ORAL_TABLET | Freq: Two times a day (BID) | ORAL | 0 refills | Status: DC

## 2021-04-24 LAB — CULTURE, BETA STREP (GROUP B ONLY): Strep Gp B Culture: NEGATIVE

## 2021-04-28 ENCOUNTER — Other Ambulatory Visit: Payer: Self-pay

## 2021-04-28 ENCOUNTER — Ambulatory Visit (INDEPENDENT_AMBULATORY_CARE_PROVIDER_SITE_OTHER): Payer: BC Managed Care – PPO | Admitting: Family Medicine

## 2021-04-28 VITALS — BP 113/70 | HR 83 | Wt 175.0 lb

## 2021-04-28 DIAGNOSIS — Z3400 Encounter for supervision of normal first pregnancy, unspecified trimester: Secondary | ICD-10-CM

## 2021-04-28 NOTE — Progress Notes (Signed)
? ?  PRENATAL VISIT NOTE ? ?Subjective:  ?Leah Vargas is a 31 y.o. G1P0 at [redacted]w[redacted]d being seen today for ongoing prenatal care.  She is currently monitored for the following issues for this low-risk pregnancy and has Hyperemesis; Elevated lipase; Hypokalemia; Supervision of normal first pregnancy, antepartum; Anemia in pregnancy; and Sickle-cell trait (HCC) on their problem list. ? ?Patient reports no complaints.  Contractions: Irritability. Vag. Bleeding: None.  Movement: Present. Denies leaking of fluid.  ? ?The following portions of the patient's history were reviewed and updated as appropriate: allergies, current medications, past family history, past medical history, past social history, past surgical history and problem list.  ? ?Objective:  ? ?Vitals:  ? 04/28/21 1445  ?BP: 113/70  ?Pulse: 83  ?Weight: 175 lb (79.4 kg)  ? ? ?Fetal Status: Fetal Heart Rate (bpm): 148 Fundal Height: 34 cm Movement: Present  Presentation: Vertex ? ?General:  Alert, oriented and cooperative. Patient is in no acute distress.  ?Skin: Skin is warm and dry. No rash noted.   ?Cardiovascular: Normal heart rate noted  ?Respiratory: Normal respiratory effort, no problems with respiration noted  ?Abdomen: Soft, gravid, appropriate for gestational age.  Pain/Pressure: Present     ?Pelvic: Cervical exam deferred        ?Extremities: Normal range of motion.  Edema: None  ?Mental Status: Normal mood and affect. Normal behavior. Normal judgment and thought content.  ? ?Assessment and Plan:  ?Pregnancy: G1P0 at [redacted]w[redacted]d ?1. Supervision of normal first pregnancy, antepartum ?Continue routine prenatal care. ? ? ?Preterm labor symptoms and general obstetric precautions including but not limited to vaginal bleeding, contractions, leaking of fluid and fetal movement were reviewed in detail with the patient. ?Please refer to After Visit Summary for other counseling recommendations.  ? ?Return in 1 week (on 05/05/2021). ? ?Future Appointments  ?Date  Time Provider Department Center  ?05/05/2021  1:15 PM Allayne Stack, DO [facility]  ?05/11/2021  1:55 PM Warden Fillers, MD [facility]  ? ? ?Reva Bores, MD ? ?

## 2021-04-28 NOTE — Patient Instructions (Signed)

## 2021-05-05 ENCOUNTER — Ambulatory Visit (INDEPENDENT_AMBULATORY_CARE_PROVIDER_SITE_OTHER): Payer: BC Managed Care – PPO | Admitting: Family Medicine

## 2021-05-05 VITALS — BP 118/67 | HR 79 | Wt 183.4 lb

## 2021-05-05 DIAGNOSIS — D573 Sickle-cell trait: Secondary | ICD-10-CM

## 2021-05-05 DIAGNOSIS — Z3A38 38 weeks gestation of pregnancy: Secondary | ICD-10-CM

## 2021-05-05 DIAGNOSIS — O368130 Decreased fetal movements, third trimester, not applicable or unspecified: Secondary | ICD-10-CM

## 2021-05-05 DIAGNOSIS — Z3400 Encounter for supervision of normal first pregnancy, unspecified trimester: Secondary | ICD-10-CM

## 2021-05-05 NOTE — Progress Notes (Signed)
? ? ?  Subjective:  ?Leah Vargas is a 31 y.o. G1P0 at [redacted]w[redacted]d being seen today for ongoing prenatal care.  She is currently monitored for the following issues for this high-risk pregnancy and has Hyperemesis; Elevated lipase; Hypokalemia; Supervision of normal first pregnancy, antepartum; Anemia in pregnancy; and Sickle-cell trait ([place]) on their problem list. ? ?Patient reports decreased fetal movement since yesterday. Still having movement but less than usual. Has also had intermittent braxton hicks. Has only eaten once today. Otherwise doing well.  Contractions: Irritability. Vag. Bleeding: None.  Movement: (!) Decreased. Denies leaking of fluid.  ? ?The following portions of the patient's history were reviewed and updated as appropriate: allergies, current medications, past family history, past medical history, past social history, past surgical history and problem list.  ? ?Objective:  ? ?Vitals:  ? 05/05/21 1335  ?BP: 118/67  ?Pulse: 79  ?Weight: 183 lb 6.4 oz (83.2 kg)  ? ? ?Fetal Status: Fetal Heart Rate (bpm): 150   Movement: (!) Decreased    ? ?General:  Alert, oriented and cooperative. Patient is in no acute distress.  ?Skin: Skin is warm and dry. No rash noted.   ?Cardiovascular: Normal heart rate noted  ?Respiratory: Normal respiratory effort, no problems with respiration noted  ?Abdomen: Soft, gravid, appropriate for gestational age. Pain/Pressure: Present     ?Pelvic:  Cervical exam deferred        ?Extremities: Normal range of motion.  Edema: None  ?Mental Status: Normal mood and affect. Normal behavior. Normal judgment and thought content.  ? ?NST: REACTIVE  ?Baseline 150 ?Mod var  ?Several 15x15 accel ?No decel  ?One contraction ? ?Assessment and Plan:  ?Pregnancy: G1P0 at [redacted]w[redacted]d ? ?1. Supervision of normal first pregnancy, antepartum ? ?2. [redacted] weeks gestation of pregnancy ? ?3. Decreased fetal movement affecting management of pregnancy in third trimester, single or unspecified fetus ?Reactive  NST with movement on exam. Only eaten once today. Encouraging eating small frequent meals and staying hydrated throughout the day. Instructed to let us know if this feeling is persistent despite changes above, if so, can discuss IOL at 39 weeks. Aware of MAU precautions.  ? ?4. 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   ? ? ?Term labor symptoms and general obstetric precautions including but not limited to vaginal bleeding, contractions, leaking of fluid and fetal movement were reviewed in detail with the patient. ?Please refer to After Visit Summary for other counseling recommendations.  ? ?Return in about 1 week (around 05/12/2021) for ROB or sooner if needed.  ? ? ?Patriciaann Clan, DO ?

## 2021-05-11 ENCOUNTER — Ambulatory Visit (INDEPENDENT_AMBULATORY_CARE_PROVIDER_SITE_OTHER): Payer: BC Managed Care – PPO | Admitting: Obstetrics and Gynecology

## 2021-05-11 VITALS — BP 125/69 | HR 77 | Wt 186.3 lb

## 2021-05-11 DIAGNOSIS — Z3A39 39 weeks gestation of pregnancy: Secondary | ICD-10-CM

## 2021-05-11 DIAGNOSIS — D573 Sickle-cell trait: Secondary | ICD-10-CM

## 2021-05-11 DIAGNOSIS — O99013 Anemia complicating pregnancy, third trimester: Secondary | ICD-10-CM

## 2021-05-11 DIAGNOSIS — Z3400 Encounter for supervision of normal first pregnancy, unspecified trimester: Secondary | ICD-10-CM

## 2021-05-11 NOTE — Progress Notes (Signed)
? ?  PRENATAL VISIT NOTE ? ?Subjective:  ?Leah Vargas is a 31 y.o. G1P0 at [redacted]w[redacted]d being seen today for ongoing prenatal care.  She is currently monitored for the following issues for this high-risk pregnancy and has Hyperemesis; Elevated lipase; Hypokalemia; Supervision of normal first pregnancy, antepartum; Anemia in pregnancy; and Sickle-cell trait ([place]) on their problem list. ? ?Patient doing well with no acute concerns today. She reports  decreased/limited fetal movement .  Contractions: Irritability. Vag. Bleeding: None.  Movement: Present. Denies leaking of fluid.  ? ?The following portions of the patient's history were reviewed and updated as appropriate: allergies, current medications, past family history, past medical history, past social history, past surgical history and problem list. Problem list updated. ? ?Objective:  ? ?Vitals:  ? 05/11/21 1402  ?BP: 125/69  ?Pulse: 77  ?Weight: 186 lb 4.8 oz (84.5 kg)  ? ? ?Fetal Status: Fetal Heart Rate (bpm): 152 Fundal Height: 38 cm Movement: Present    ? ?General:  Alert, oriented and cooperative. Patient is in no acute distress.  ?Skin: Skin is warm and dry. No rash noted.   ?Cardiovascular: Normal heart rate noted  ?Respiratory: Normal respiratory effort, no problems with respiration noted  ?Abdomen: Soft, gravid, appropriate for gestational age.  Pain/Pressure: Present     ?Pelvic: Cervical exam deferred        ?Extremities: Normal range of motion.  Edema: Mild pitting, slight indentation  ?Mental Status:  Normal mood and affect. Normal behavior. Normal judgment and thought content.  ? ?Assessment and Plan:  ?Pregnancy: G1P0 at [redacted]w[redacted]d ? ?1. [redacted] weeks gestation of pregnancy ? ? ?2. Supervision of normal first pregnancy, antepartum ?Due to two weeks of reported decreased fetal movement, will post patient for IOL ?NST today reactive with no decels ? ?3. Sickle-cell trait ([place]) ? ? ?4. Anemia during pregnancy in third trimester ? ? ?Term labor symptoms and  general obstetric precautions including but not limited to vaginal bleeding, contractions, leaking of fluid and fetal movement were reviewed in detail with the patient. ? ?Please refer to After Visit Summary for other counseling recommendations.  ? ?IOL on 05/12/21 ? ? ?Lynnda Shields, MD ?Faculty Attending ?Center for [place] ?  ?

## 2021-05-12 ENCOUNTER — Encounter (HOSPITAL_COMMUNITY): Payer: Self-pay | Admitting: Obstetrics and Gynecology

## 2021-05-12 ENCOUNTER — Inpatient Hospital Stay (HOSPITAL_COMMUNITY)
Admission: AD | Admit: 2021-05-12 | Discharge: 2021-05-14 | DRG: 807 | Disposition: A | Discharge status: Home / Self Care | Payer: BC Managed Care – PPO | Attending: Obstetrics and Gynecology | Admitting: Obstetrics and Gynecology

## 2021-05-12 ENCOUNTER — Inpatient Hospital Stay (HOSPITAL_COMMUNITY): Payer: BC Managed Care – PPO | Admitting: Anesthesiology

## 2021-05-12 ENCOUNTER — Inpatient Hospital Stay (HOSPITAL_COMMUNITY): Payer: BC Managed Care – PPO

## 2021-05-12 DIAGNOSIS — O368190 Decreased fetal movements, unspecified trimester, not applicable or unspecified: Secondary | ICD-10-CM | POA: Diagnosis present

## 2021-05-12 DIAGNOSIS — O368130 Decreased fetal movements, third trimester, not applicable or unspecified: Secondary | ICD-10-CM | POA: Diagnosis present

## 2021-05-12 DIAGNOSIS — Z3A39 39 weeks gestation of pregnancy: Secondary | ICD-10-CM | POA: Diagnosis not present

## 2021-05-12 DIAGNOSIS — O9902 Anemia complicating childbirth: Secondary | ICD-10-CM | POA: Diagnosis present

## 2021-05-12 DIAGNOSIS — D509 Iron deficiency anemia, unspecified: Secondary | ICD-10-CM | POA: Diagnosis present

## 2021-05-12 DIAGNOSIS — O99013 Anemia complicating pregnancy, third trimester: Secondary | ICD-10-CM

## 2021-05-12 DIAGNOSIS — R1110 Vomiting, unspecified: Secondary | ICD-10-CM | POA: Diagnosis present

## 2021-05-12 DIAGNOSIS — D573 Sickle-cell trait: Secondary | ICD-10-CM | POA: Diagnosis present

## 2021-05-12 DIAGNOSIS — Z3400 Encounter for supervision of normal first pregnancy, unspecified trimester: Secondary | ICD-10-CM

## 2021-05-12 LAB — CBC
HCT: 30 % — ABNORMAL LOW (ref 36.0–46.0)
Hemoglobin: 8.9 g/dL — ABNORMAL LOW (ref 12.0–15.0)
MCH: 21.7 pg — ABNORMAL LOW (ref 26.0–34.0)
MCHC: 29.7 g/dL — ABNORMAL LOW (ref 30.0–36.0)
MCV: 73.2 fL — ABNORMAL LOW (ref 80.0–100.0)
Platelets: 197 10*3/uL (ref 150–400)
RBC: 4.1 MIL/uL (ref 3.87–5.11)
RDW: 23.8 % — ABNORMAL HIGH (ref 11.5–15.5)
WBC: 6.9 10*3/uL (ref 4.0–10.5)
nRBC: 0.3 % — ABNORMAL HIGH (ref 0.0–0.2)

## 2021-05-12 LAB — TYPE AND SCREEN
ABO/RH(D): A POS
Antibody Screen: NEGATIVE

## 2021-05-12 LAB — RPR: RPR Ser Ql: NONREACTIVE

## 2021-05-12 MED ORDER — EPHEDRINE 5 MG/ML INJ
10.0000 mg | INTRAVENOUS | Status: DC | PRN
Start: 2021-05-12 — End: 2021-05-13

## 2021-05-12 MED ORDER — ACETAMINOPHEN 325 MG PO TABS: 650.0000 mg | ORAL_TABLET | ORAL | Status: DC | PRN

## 2021-05-12 MED ORDER — LIDOCAINE HCL (PF) 1 % IJ SOLN
30.0000 mL | INTRAMUSCULAR | Status: AC | PRN
  Administered 2021-05-13: 30 mL via SUBCUTANEOUS
  Filled 2021-05-12: qty 30

## 2021-05-12 MED ORDER — LACTATED RINGERS IV SOLN: 500.0000 mL | INTRAVENOUS | Status: DC | PRN

## 2021-05-12 MED ORDER — OXYTOCIN-SODIUM CHLORIDE 30-0.9 UT/500ML-% IV SOLN
1.0000 m[IU]/min | INTRAVENOUS | Status: DC
  Administered 2021-05-12: 2 m[IU]/min via INTRAVENOUS
  Filled 2021-05-12: qty 500

## 2021-05-12 MED ORDER — MISOPROSTOL 25 MCG QUARTER TABLET
25.0000 ug | ORAL_TABLET | ORAL | Status: DC | PRN
  Administered 2021-05-12: 25 ug via VAGINAL
  Filled 2021-05-12: qty 1

## 2021-05-12 MED ORDER — OXYTOCIN-SODIUM CHLORIDE 30-0.9 UT/500ML-% IV SOLN
2.5000 [IU]/h | INTRAVENOUS | Status: DC
  Administered 2021-05-13: 2.5 [IU]/h via INTRAVENOUS

## 2021-05-12 MED ORDER — DIPHENHYDRAMINE HCL 50 MG/ML IJ SOLN: 12.5000 mg | INTRAMUSCULAR | Status: DC | PRN

## 2021-05-12 MED ORDER — FENTANYL-BUPIVACAINE-NACL 0.5-0.125-0.9 MG/250ML-% EP SOLN
EPIDURAL | Status: DC | PRN
  Administered 2021-05-12: 12 mL/h via EPIDURAL

## 2021-05-12 MED ORDER — FENTANYL CITRATE (PF) 100 MCG/2ML IJ SOLN: 50.0000 ug | INTRAMUSCULAR | Status: DC | PRN

## 2021-05-12 MED ORDER — EPHEDRINE 5 MG/ML INJ: 10.0000 mg | INTRAVENOUS | Status: DC | PRN

## 2021-05-12 MED ORDER — ONDANSETRON HCL 4 MG/2ML IJ SOLN
4.0000 mg | Freq: Four times a day (QID) | INTRAMUSCULAR | Status: DC | PRN
Start: 2021-05-12 — End: 2021-05-13

## 2021-05-12 MED ORDER — OXYTOCIN BOLUS FROM INFUSION
333.0000 mL | Freq: Once | INTRAVENOUS | Status: AC
Start: 2021-05-12 — End: 2021-05-13
  Administered 2021-05-13: 333 mL via INTRAVENOUS

## 2021-05-12 MED ORDER — FENTANYL-BUPIVACAINE-NACL 0.5-0.125-0.9 MG/250ML-% EP SOLN
12.0000 mL/h | EPIDURAL | Status: DC | PRN
  Filled 2021-05-12: qty 250

## 2021-05-12 MED ORDER — OXYCODONE-ACETAMINOPHEN 5-325 MG PO TABS: 1.0000 | ORAL_TABLET | ORAL | Status: DC | PRN

## 2021-05-12 MED ORDER — LACTATED RINGERS IV SOLN: INTRAVENOUS | Status: DC

## 2021-05-12 MED ORDER — PHENYLEPHRINE 40 MCG/ML (10ML) SYRINGE FOR IV PUSH (FOR BLOOD PRESSURE SUPPORT): 80.0000 ug | PREFILLED_SYRINGE | INTRAVENOUS | Status: DC | PRN

## 2021-05-12 MED ORDER — PHENYLEPHRINE 40 MCG/ML (10ML) SYRINGE FOR IV PUSH (FOR BLOOD PRESSURE SUPPORT)
80.0000 ug | PREFILLED_SYRINGE | INTRAVENOUS | Status: DC | PRN
Start: 2021-05-12 — End: 2021-05-13

## 2021-05-12 MED ORDER — SOD CITRATE-CITRIC ACID 500-334 MG/5ML PO SOLN: 30.0000 mL | ORAL | Status: DC | PRN

## 2021-05-12 MED ORDER — LACTATED RINGERS IV SOLN
500.0000 mL | Freq: Once | INTRAVENOUS | Status: AC
  Administered 2021-05-12: 500 mL via INTRAVENOUS

## 2021-05-12 MED ORDER — TERBUTALINE SULFATE 1 MG/ML IJ SOLN
0.2500 mg | Freq: Once | INTRAMUSCULAR | Status: AC | PRN
  Administered 2021-05-12: 0.25 mg via SUBCUTANEOUS
  Filled 2021-05-12: qty 1

## 2021-05-12 MED ORDER — LIDOCAINE HCL (PF) 1 % IJ SOLN
INTRAMUSCULAR | Status: DC | PRN
  Administered 2021-05-12: 5 mL via EPIDURAL

## 2021-05-12 NOTE — Progress Notes (Signed)
Leah Vargas is a 31 y.o. G1P0 at [redacted]w[redacted]d  admitted for induction of labor due to DFM at term. ? ?Subjective: ?Reports she feels well after epidural. Still feeling some tightening of abdomen with contractions but feels it is bearable. ? ?Objective: ?BP 122/77   Pulse 76   Temp 99.1 ?F (37.3 ?C) (Oral)   Resp 15   Ht 5\' 5"  (1.651 m)   Wt 85.6 kg   LMP 08/12/2020 (Exact Date)   SpO2 100%   BMI 31.42 kg/m?  ?No intake/output data recorded. ?No intake/output data recorded. ? ?FHT:  FHR: 150 bpm, variability: moderate,  accelerations:  Present,  decelerations:  Absent ?UC:   regular, every 5 minutes ?SVE:   Dilation: 5 ?Effacement (%): 80, 90 ?Station: -2 ?Exam by:: Dr. [email or web address] ? ?Labs: ?Lab Results  ?Component Value Date  ? WBC 6.9 05/12/2021  ? HGB 8.9 (L) 05/12/2021  ? HCT 30.0 (L) 05/12/2021  ? MCV 73.2 (L) 05/12/2021  ? PLT 197 05/12/2021  ? ? ?Assessment / Plan: ?G1P0 at [redacted]w[redacted]d  admitted for induction of labor due to DFM at term. ? ?Labor:  s/p cytotec, FB. Now has epidural and cervix now 5 cm. AROMed during current check and tolerated well by patient and fetus. Will monitor for about 20 mins and if not contracting too much will start pitocin 2x2. ? ?Fetal Wellbeing:  Category I ?Pain Control:  Epidural ?I/D:   GBS negative ? ? ?#Anemia ?Admission HgB 8.9. Low threshold to give TXA at delivery. ? ?[redacted]w[redacted]d ?05/12/2021, 10:01 PM ? ? ?

## 2021-05-12 NOTE — Anesthesia Procedure Notes (Signed)
Epidural ?Patient location during procedure: OB ?Start time: 05/12/2021 8:44 PM ?End time: 05/12/2021 8:59 PM ? ?Staffing ?Anesthesiologist: Trevor Iha, MD ?Performed: anesthesiologist  ? ?Preanesthetic Checklist ?Completed: patient identified, IV checked, site marked, risks and benefits discussed, surgical consent, monitors and equipment checked, pre-op evaluation and timeout performed ? ?Epidural ?Patient position: sitting ?Prep: DuraPrep and site prepped and draped ?Patient monitoring: continuous pulse ox and blood pressure ?Approach: midline ?Location: L3-L4 ?Injection technique: LOR air ? ?Needle:  ?Needle type: Tuohy  ?Needle gauge: 17 G ?Needle length: 9 cm and 9 ?Needle insertion depth: 6 cm ?Catheter type: closed end flexible ?Catheter size: 19 Gauge ?Catheter at skin depth: 11 cm ?Test dose: negative ? ?Assessment ?Events: blood not aspirated, injection not painful, no injection resistance, no paresthesia and negative IV test ? ?Additional Notes ?Patient identified. Risks/Benefits/Options discussed with patient including but not limited to bleeding, infection, nerve damage, paralysis, failed block, incomplete pain control, headache, blood pressure changes, nausea, vomiting, reactions to medication both or allergic, itching and postpartum back pain. Confirmed with bedside nurse the patient's most recent platelet count. Confirmed with patient that they are not currently taking any anticoagulation, have any bleeding history or any family history of bleeding disorders. Patient expressed understanding and wished to proceed. All questions were answered. Sterile technique was used throughout the entire procedure. Please see nursing notes for vital signs. Test dose was given through epidural needle and negative prior to continuing to dose epidural or start infusion. Warning signs of high block given to the patient including shortness of breath, tingling/numbness in hands, complete motor block, or any  concerning symptoms with instructions to call for help. Patient was given instructions on fall risk and not to get out of bed. All questions and concerns addressed with instructions to call with any issues.  1 Attempt (S) . Patient tolerated procedure well. ? ? ? ?

## 2021-05-12 NOTE — Progress Notes (Addendum)
Labor Progress Note ?Leah Vargas is a 31 y.o. G1P0 at [redacted]w[redacted]d presented for IOL 2/2 DFM. ? ?S: Doing well. No concerns at this time. Family at bedside.  ? ?O:  ?BP 111/80   Pulse 73   Temp 99.7 ?F (37.6 ?C) (Oral)   Resp 16   Ht 5\' 5"  (1.651 m)   Wt 85.6 kg   LMP 08/12/2020 (Exact Date)   SpO2 99%   BMI 31.42 kg/m?  ? ?EFM: Baseline 150, moderate variability, + accels, occasional variable decel ? ?CVE: Dilation: 1 ?Effacement (%): 50 ?Cervical Position: Posterior ?Station: -3 ?Presentation: Vertex (Bedside 10/13/2020.) ?Exam by:: [email or web address] MD ? ?A&P: 31 y.o. G1P0 [redacted]w[redacted]d  ? ?#Labor: Progressing well. Foley balloon placed this check without difficulty. Mom and baby tolerated this well. Continues to have regular contractions. Will hold on additional Cytotec for now and reassess in 4 hours.  ?#Pain: PRN, would like an epidural  ?#FWB: Cat 2 due to intermittent variable decels. Continues to have accels and reassuring variability. Received Terbutaline earlier after initial Cytotec. No issues since then. Will continue to monitor closely.  ?#GBS negative ? ?[redacted]w[redacted]d, DO, PGY1 ?1:44 PM  ? ?GME ATTESTATION:  ?I saw and evaluated the patient. I agree with the findings and the plan of care as documented in the resident?s note. I have made changes to documentation as necessary. ? ?Derenda Fennel, MD ?OB Fellow, Faculty Practice ?[place] ?05/12/2021 5:39 PM ? ?

## 2021-05-12 NOTE — Anesthesia Preprocedure Evaluation (Addendum)
Anesthesia Evaluation  ?Patient identified by MRN, date of birth, ID band ?Patient awake ? ? ? ?Reviewed: ?Allergy & Precautions, NPO status , Patient's Chart, lab work & pertinent test results ? ?Airway ?Mallampati: II ? ?TM Distance: >3 FB ?Neck ROM: Full ? ? ? Dental ?no notable dental hx. ?(+) Teeth Intact, Dental Advisory Given ?  ?Pulmonary ?neg pulmonary ROS,  ?  ?Pulmonary exam normal ?breath sounds clear to auscultation ? ? ? ? ? ? Cardiovascular ?negative cardio ROS ?Normal cardiovascular exam ?Rhythm:Regular Rate:Normal ? ? ?  ?Neuro/Psych ?negative neurological ROS ? negative psych ROS  ? GI/Hepatic ?negative GI ROS, Neg liver ROS,   ?Endo/Other  ?negative endocrine ROS ? Renal/GU ?negative Renal ROSLab Results ?     Component                Value               Date                 ?        K                        3.4 (L)             11/27/2020           ?   ? ?  ?Musculoskeletal ? ? Abdominal ?  ?Peds ? Hematology ? ?(+) Blood dyscrasia, Sickle cell trait and anemia , Lab Results ?     Component                Value               Date                 ?     WBC                      6.9                 05/12/2021           ?     HGB                      8.9 (L)             05/12/2021           ?     HCT                      30.0 (L)            05/12/2021           ?     MCV                      73.2 (L)            05/12/2021           ?     PLT                      197                 05/12/2021           ?   ?Anesthesia Other Findings ? ? Reproductive/Obstetrics ?(+) Pregnancy ? ?  ? ? ? ? ? ? ? ? ? ? ? ? ? ?  ?  ? ? ? ? ? ? ? ?  Anesthesia Physical ?Anesthesia Plan ? ?ASA: 2 ? ?Anesthesia Plan: Epidural  ? ?Post-op Pain Management:   ? ?Induction:  ? ?PONV Risk Score and Plan:  ? ?Airway Management Planned:  ? ?Additional Equipment:  ? ?Intra-op Plan:  ? ?Post-operative Plan:  ? ?Informed Consent: I have reviewed the patients History and Physical, chart, labs and  discussed the procedure including the risks, benefits and alternatives for the proposed anesthesia with the patient or authorized representative who has indicated his/her understanding and acceptance.  ? ? ? ? ? ?Plan Discussed with: CRNA and Anesthesiologist ? ?Anesthesia Plan Comments: (39.4 wk Primagravida anemia  for LEA)  ? ? ? ? ? ?Anesthesia Quick Evaluation ? ?

## 2021-05-12 NOTE — Progress Notes (Signed)
Resident at bedside.  

## 2021-05-12 NOTE — H&P (Addendum)
OBSTETRIC ADMISSION HISTORY AND PHYSICAL ? ?Leah Vargas is a 31 y.o. female G1P0 with IUP at [redacted]w[redacted]d by LMP presenting for IOL 2/2 to DFM. She reports decreased fetal movement that started last week, no LOF, no VB, no blurry vision, headaches or peripheral edema, and no RUQ pain.  She plans on breast feeding. She is unsure about birth control at this time. ? ?She received her prenatal care at [facility].  ? ?Dating: By LMP --->  Estimated Date of Delivery: 05/15/21 ? ?Sono:   ?@[redacted]w[redacted]d , normal anatomy, cephalic presentation, anterior fundal placental lie, 2314g, 42% EFW ? ?Prenatal History/Complications:  ?-- Decreased fetal movement ?-- Iron deficiency anemia in pregnancy ?-- Sickle-cell trait ?-- Hyperemesis  ? ?Past Medical History: ?Past Medical History:  ?Diagnosis Date  ? Medical history non-contributory   ? ? ?Past Surgical History: ?Past Surgical History:  ?Procedure Laterality Date  ? NO PAST SURGERIES    ? ? ?Obstetrical History: ?OB History   ? ? Gravida  ?1  ? Para  ?0  ? Term  ?   ? Preterm  ?   ? AB  ?   ? Living  ?   ?  ? ? SAB  ?   ? IAB  ?   ? Ectopic  ?   ? Multiple  ?   ? Live Births  ?   ?   ?  ?  ? ? ?Social History ?Social History  ? ?Socioeconomic History  ? Marital status: Single  ?  Spouse name:  ? Number of children: Not on file  ? Years of education: Not on file  ? Highest education level: Master's degree (e.g., MA, MS, MEng, MEd, MSW, MBA)  ?Occupational History  ? Occupation: Nancy Marus  ?  Comment: [employer]  ?Tobacco Use  ? Smoking status: Never  ?  Passive exposure: Never  ? Smokeless tobacco: Never  ?Vaping Use  ? Vaping Use: Never used  ?Substance and Sexual Activity  ? Alcohol use: Not Currently  ? Drug use: Never  ? Sexual activity: Not Currently  ?Other Topics Concern  ? Not on file  ?Social History Narrative  ? Not on file  ? ?Social Determinants of Health  ? ?Financial Resource Strain: Not on file  ?Food Insecurity: No Food Insecurity  ? Worried About  Consulting civil engineer in the Last Year: Never true  ? Ran Out of Food in the Last Year: Never true  ?Transportation Needs: No Transportation Needs  ? Lack of Transportation (Medical): No  ? Lack of Transportation (Non-Medical): No  ?Physical Activity: Not on file  ?Stress: Not on file  ?Social Connections: Not on file  ? ? ?Family History: ?Family History  ?Problem Relation Age of Onset  ? Hypertension Mother   ? Healthy Mother   ? Healthy Father   ? Asthma Sister   ? ? ?Allergies: ?No Known Allergies ? ?Medications Prior to Admission  ?Medication Sig Dispense Refill Last Dose  ? iron polysaccharides (NIFEREX) 150 MG capsule Take 1 capsule (150 mg total) by mouth every other day. 30 capsule 1   ? metroNIDAZOLE (FLAGYL) 500 MG tablet Take 1 tablet (500 mg total) by mouth 2 (two) times daily. 14 tablet 0   ? ? ? ?Review of Systems  ?All systems reviewed and negative except as stated in HPI ? ?Blood pressure 110/70, pulse 80, temperature 99.7 ?F (37.6 ?C), temperature source Oral, resp. rate 15, height 5\' 5"  (1.651 m), weight  85.6 kg, last menstrual period 08/12/2020, SpO2 99 %. ? ?General appearance: alert, cooperative, and no distress ?Lungs: normal work of breathing on room air ?Heart: normal rate, warm and well perfused  ?Abdomen: soft, non-tender, gravid  ?Extremities: no LE edema or calf tenderness to palpation  ? ?Presentation: Cephalic per RN ?Fetal monitoring: Baseline: 140 bpm, moderate variability, + accels, no decels  ?Uterine activity: None ?Dilation: Fingertip ?Effacement (%): 80 ?Station: -3 ?Exam by:: Ferne Coe RNC ? ? ?Prenatal labs: ?ABO, Rh: --/--/A POS (04/11 0800) ?Antibody: NEG (04/11 0800) ?Rubella: 26.10 (10/10 2458) ?RPR: Non Reactive (01/27 0841)  ?HBsAg: Negative (10/10 0942)  ?HIV: Non Reactive (01/27 0841)  ?GBS: Negative/-- (03/20 1212)  ?GTT normal ?Genetic screening:  NIPS low risk, AFP negative, Horizon: Hb S/C - confirmed via amniocentesis  ?Anatomy US normal ? ?Prenatal Transfer Tool   ?Maternal Diabetes: No ?Genetic Screening: Abnormal:  Results: Other: Hb S/C ?Maternal Ultrasounds/Referrals: Normal ?Fetal Ultrasounds or other Referrals:  None ?Maternal Substance Abuse:  No ?Significant Maternal Medications:  Meds include: Other: Iron ?Significant Maternal Lab Results: Group B Strep negative ? ?Results for orders placed or performed during the hospital encounter of 05/12/21 (from the past 24 hour(s))  ?CBC  ? Collection Time: 05/12/21  8:00 AM  ?Result Value Ref Range  ? WBC 6.9 4.0 - 10.5 K/uL  ? RBC 4.10 3.87 - 5.11 MIL/uL  ? Hemoglobin 8.9 (L) 12.0 - 15.0 g/dL  ? HCT 30.0 (L) 36.0 - 46.0 %  ? MCV 73.2 (L) 80.0 - 100.0 fL  ? MCH 21.7 (L) 26.0 - 34.0 pg  ? MCHC 29.7 (L) 30.0 - 36.0 g/dL  ? RDW 23.8 (H) 11.5 - 15.5 %  ? Platelets 197 150 - 400 K/uL  ? nRBC 0.3 (H) 0.0 - 0.2 %  ?Type and screen  ? Collection Time: 05/12/21  8:00 AM  ?Result Value Ref Range  ? ABO/RH(D) PENDING   ? Antibody Screen PENDING   ? Sample Expiration    ?  05/15/2021,2359 ?Performed at [facility], [address], [place], [place] [address] ?  ? ? ?Patient Active Problem List  ? Diagnosis Date Noted  ? Decreased fetal movement 05/12/2021  ? Sickle-cell trait (HCC) 01/29/2021  ? Anemia in pregnancy 11/11/2020  ? Supervision of normal first pregnancy, antepartum 11/10/2020  ? Elevated lipase 10/25/2020  ? Hypokalemia 10/25/2020  ? Hyperemesis 10/23/2020  ? ? ?Assessment/Plan:  ?Leah Vargas is a 31 y.o. G1P0 at [redacted]w[redacted]d here for IOL 2/2 to DFM at term.  ? ?#Labor: Will start induction with Cytotec and reassess in 4 hours. Will attempt foley balloon placement when able.  ?#Pain: PRN, patient would like an epidural when ready  ?#FWB: Cat 1 ?#ID: GBS negative  ?#MOF: Breast and bottle ?#MOC: Unsure   ? ?#Iron deficiency anemia: Hgb 8.9 on admission. Will check post-delivery and replete as appropriate.  ? ?#DFM: Reactive tracing on admission. Will continue to monitor closely.  ? ?#Hb S/C: Further management per  pediatrics after delivery.  ? ?Derenda Fennel, DO, PGY-1 ?05/12/2021, 8:55 AM ? ?GME ATTESTATION:  ?I saw and evaluated the patient. I agree with the findings and the plan of care as documented in the resident?s note. I have made changes to documentation as necessary. ? ?Evalina Field, MD ?OB Fellow, Faculty Practice ?[place], Center for [facility] ?05/12/2021 5:43 PM ?  ? ?

## 2021-05-12 NOTE — Progress Notes (Signed)
Labor Progress Note ?Oluwatamilore Duncan Dull is a 31 y.o. G1P0 at [redacted]w[redacted]d who presented for IOL due to DFM at term. ? ?S: Doing well. Foley balloon recently dislodged. No concerns.  ? ?O:  ?BP 138/81   Pulse 64   Temp 99.1 ?F (37.3 ?C) (Oral)   Resp 15   Ht 5\' 5"  (1.651 m)   Wt 85.6 kg   LMP 08/12/2020 (Exact Date)   SpO2 99%   BMI 31.42 kg/m?  ? ?EFM: Baseline 145 bpm, moderate variability, + accels, no decels  ?Toco: Every 1-5 minutes  ? ?CVE: Dilation: 4.5 ?Effacement (%): 60 ?Cervical Position: Posterior ?Station: -3 ?Presentation: Vertex ?Exam by:: [email or web address], RN ? ?A&P: 31 y.o. G1P0 [redacted]w[redacted]d  ? ?#Labor: Progressing well s/p Cytotec and foley balloon placement. Having irregular contractions every 1-5 minutes. Will start Pitocin 2x2 to achieve more adequate contraction pattern. Will plan to reassess in 4 hours.  ?#Pain: PRN; IV Fentanyl for now, planning for epidural when ready  ?#FWB: Cat 1  ?#GBS negative ? ?[redacted]w[redacted]d, MD ?7:42 PM ? ?

## 2021-05-13 ENCOUNTER — Other Ambulatory Visit: Payer: Self-pay

## 2021-05-13 ENCOUNTER — Encounter (HOSPITAL_COMMUNITY): Payer: Self-pay | Admitting: Obstetrics and Gynecology

## 2021-05-13 DIAGNOSIS — O368130 Decreased fetal movements, third trimester, not applicable or unspecified: Secondary | ICD-10-CM

## 2021-05-13 DIAGNOSIS — Z3A39 39 weeks gestation of pregnancy: Secondary | ICD-10-CM

## 2021-05-13 MED ORDER — MEDROXYPROGESTERONE ACETATE 150 MG/ML IM SUSP: 150.0000 mg | INTRAMUSCULAR | Status: DC | PRN

## 2021-05-13 MED ORDER — MEASLES, MUMPS & RUBELLA VAC IJ SOLR: 0.5000 mL | Freq: Once | INTRAMUSCULAR | Status: DC

## 2021-05-13 MED ORDER — BENZOCAINE-MENTHOL 20-0.5 % EX AERO: 1.0000 "application " | INHALATION_SPRAY | CUTANEOUS | Status: DC | PRN

## 2021-05-13 MED ORDER — WITCH HAZEL-GLYCERIN EX PADS: 1.0000 "application " | MEDICATED_PAD | CUTANEOUS | Status: DC | PRN

## 2021-05-13 MED ORDER — TETANUS-DIPHTH-ACELL PERTUSSIS 5-2.5-18.5 LF-MCG/0.5 IM SUSY: 0.5000 mL | PREFILLED_SYRINGE | Freq: Once | INTRAMUSCULAR | Status: DC

## 2021-05-13 MED ORDER — SENNOSIDES-DOCUSATE SODIUM 8.6-50 MG PO TABS
2.0000 | ORAL_TABLET | Freq: Every day | ORAL | Status: DC
  Administered 2021-05-14: 2 via ORAL
  Filled 2021-05-13: qty 2

## 2021-05-13 MED ORDER — POLYETHYLENE GLYCOL 3350 17 G PO PACK
17.0000 g | PACK | Freq: Every day | ORAL | Status: DC
  Administered 2021-05-13 – 2021-05-14 (×2): 17 g via ORAL
  Filled 2021-05-13 (×2): qty 1

## 2021-05-13 MED ORDER — IBUPROFEN 600 MG PO TABS
600.0000 mg | ORAL_TABLET | Freq: Four times a day (QID) | ORAL | Status: DC
  Administered 2021-05-13 – 2021-05-14 (×2): 600 mg via ORAL
  Filled 2021-05-13 (×5): qty 1

## 2021-05-13 MED ORDER — ACETAMINOPHEN 325 MG PO TABS
650.0000 mg | ORAL_TABLET | ORAL | Status: DC | PRN
  Administered 2021-05-13: 650 mg via ORAL
  Filled 2021-05-13: qty 2

## 2021-05-13 MED ORDER — DIPHENHYDRAMINE HCL 25 MG PO CAPS: 25.0000 mg | ORAL_CAPSULE | Freq: Four times a day (QID) | ORAL | Status: DC | PRN

## 2021-05-13 MED ORDER — PRENATAL MULTIVITAMIN CH
1.0000 | ORAL_TABLET | Freq: Every day | ORAL | Status: DC
  Administered 2021-05-13 – 2021-05-14 (×2): 1 via ORAL
  Filled 2021-05-13 (×2): qty 1

## 2021-05-13 MED ORDER — ONDANSETRON HCL 4 MG PO TABS: 4.0000 mg | ORAL_TABLET | ORAL | Status: DC | PRN

## 2021-05-13 MED ORDER — COCONUT OIL OIL
1.0000 | TOPICAL_OIL | Status: DC | PRN
Start: 2021-05-13 — End: 2021-05-14

## 2021-05-13 MED ORDER — DIBUCAINE (PERIANAL) 1 % EX OINT: 1.0000 "application " | TOPICAL_OINTMENT | CUTANEOUS | Status: DC | PRN

## 2021-05-13 MED ORDER — SIMETHICONE 80 MG PO CHEW: 80.0000 mg | CHEWABLE_TABLET | ORAL | Status: DC | PRN

## 2021-05-13 MED ORDER — ONDANSETRON HCL 4 MG/2ML IJ SOLN: 4.0000 mg | INTRAMUSCULAR | Status: DC | PRN

## 2021-05-13 NOTE — Progress Notes (Signed)
Patient states she was able to urinate. Nurse had not observed urination, however patient states it was a lot of pee. ?

## 2021-05-13 NOTE — Lactation Note (Signed)
This note was copied from a baby's chart. ?Lactation Consultation Note ? ?Patient Name: Leah Vargas ?Today's Date: 05/13/2021 ?Reason for consult: Initial assessment ?Age:30 hours ? ?P1, Mother states she will eventually like to pump and bottle feed breastmilk.  Reviewed hand expression with drops. ?Attempted latching in both cradle and football holds. ?Baby sleepy.  Discussed depth and frequency. ?Encouraged breastfeeding on demand and later working on pumping with personal DEBP. ?Feed on demand with cues.  Goal 8-12+ times per day after first 24 hrs.  Place baby STS if not cueing.  ?Mom made aware of O/P services, breastfeeding support groups, community resources, and our phone # for post-discharge questions.  ? ? ?Maternal Data ?Has patient been taught Hand Expression?: Yes ?Does the patient have breastfeeding experience prior to this delivery?: No ? ?Feeding ?Mother's Current Feeding Choice: Breast Milk ? ? ?Interventions ?Interventions: Breast feeding basics reviewed;Assisted with latch;Skin to skin;Hand express;Education;LC Services brochure ? ?Discharge ?Pump: Personal;DEBP ? ?Consult Status ?Consult Status: Follow-up ?Date: 05/14/21 ?Follow-up type: In-patient ? ? ? ?Dahlia Byes Boschen ?05/13/2021, 10:16 AM ? ? ? ?

## 2021-05-13 NOTE — Anesthesia Postprocedure Evaluation (Signed)
Anesthesia Post Note ? ?Patient: Leah Vargas ? ?Procedure(s) Performed: AN AD HOC LABOR EPIDURAL ? ?  ? ?Patient location during evaluation: Mother Baby ?Anesthesia Type: Epidural ?Level of consciousness: awake and alert ?Pain management: pain level controlled ?Vital Signs Assessment: post-procedure vital signs reviewed and stable ?Respiratory status: spontaneous breathing, nonlabored ventilation and respiratory function stable ?Cardiovascular status: stable ?Postop Assessment: no headache, no backache and epidural receding ?Anesthetic complications: no ? ? ?No notable events documented. ? ?Last Vitals:  ?Vitals:  ? 05/13/21 0911 05/13/21 1241  ?BP: (!) 116/59 (!) 113/59  ?Pulse: 98 (!) 102  ?Resp: 16 17  ?Temp: 37.3 ?C 36.9 ?C  ?SpO2: 100% 99%  ?  ?Last Pain:  ?Vitals:  ? 05/13/21 1241  ?TempSrc: Oral  ?PainSc:   ? ?Pain Goal:   ? ?  ?  ?  ?  ?  ?  ?  ? ?[name] ? ? ? ? ?

## 2021-05-13 NOTE — Discharge Summary (Addendum)
? ?  Postpartum Discharge Summary ? ?   ?Patient Name: Leah Vargas ?DOB: [birth year] ?MRN: [id number] ? ?Date of admission: 05/12/2021 ?Delivery date:05/13/2021  ?Delivering provider: Renard Matter  ?Date of discharge: 05/14/2021 ? ?Admitting diagnosis: Decreased fetal movement [O36.8190] ?Intrauterine pregnancy: [redacted]w[redacted]d    ?Secondary diagnosis:  Principal Problem: ?  Decreased fetal movement ?Active Problems: ?  Hyperemesis ?  Supervision of normal first pregnancy, antepartum ?  Iron deficiency anemia during pregnancy ?  Sickle-cell trait ([place] ?  SVD (spontaneous vaginal delivery) ? ?Additional problems: None     ?Discharge diagnosis: Term Pregnancy Delivered                                              ?Post partum procedures:IV iron ?Augmentation: AROM, Pitocin, Cytotec, and IP Foley ?Complications: None ? ?Hospital course: Induction of Labor With Vaginal Delivery   ?31y.o. yo G1P1001 at 363w5das admitted to the hospital 05/12/2021 for induction of labor.  Indication for induction:  decreased fetal movement at term .  Patient had an uncomplicated labor course as follows: ?Membrane Rupture Time/Date: 9:40 PM ,05/12/2021   ?Delivery Method:Vaginal, Spontaneous  ?Episiotomy: None  ?Lacerations:  2nd degree  ?Details of delivery can be found in separate delivery note.  Patient had a routine postpartum course. Patient is discharged home 05/14/21. IV iron given day of discharge for anemia ? ?Newborn Data: ?Birth date:05/13/2021  ?Birth time:5:00 AM  ?Gender:Female  ?Living status:Living  ?Apgars:7 ,8  ?Weight:3220 g  ? ?Magnesium Sulfate received: No ?BMZ received: No ?Rhophylac:N/A ?MMR:N/A ?T-DaP:Given prenatally ?Flu: N/A ?Transfusion:No ? ?Physical exam  ?Vitals:  ? 05/13/21 1241 05/13/21 1559 05/13/21 2015 05/14/21 061941?BP: (!) 113/59 116/63 107/60 115/65  ?Pulse: (!) 102 92 88 92  ?Resp: '17 18 17 16  ' ?Temp: 98.4 ?F (36.9 ?C) (!) 97.4 ?F (36.3 ?C) 97.8 ?F (36.6 ?C) 97.7 ?F (36.5 ?C)  ?TempSrc: Oral Oral Oral  Oral  ?SpO2: 99% 99% 99% 100%  ?Weight:      ?Height:      ? ?General: alert, cooperative, and no distress ?Lochia: appropriate ?Uterine Fundus: firm ?Incision: N/A ?DVT Evaluation: No evidence of DVT seen on physical exam. ?Labs: ?Lab Results  ?Component Value Date  ? WBC 12.5 (H) 05/14/2021  ? HGB 7.8 (L) 05/14/2021  ? HCT 25.2 (L) 05/14/2021  ? MCV 72.4 (L) 05/14/2021  ? PLT 188 05/14/2021  ? ? ?  Latest Ref Rng & Units 11/27/2020  ? 12:17 PM  ?CMP  ?Glucose 70 - 99 mg/dL 65    ?BUN 6 - 20 mg/dL <5    ?Creatinine 0.44 - 1.00 mg/dL 0.52    ?Sodium 135 - 145 mmol/L 132    ?Potassium 3.5 - 5.1 mmol/L 3.4    ?Chloride 98 - 111 mmol/L 101    ?CO2 22 - 32 mmol/L 20    ?Calcium 8.9 - 10.3 mg/dL 9.5    ?Total Protein 6.5 - 8.1 g/dL 8.0    ?Total Bilirubin 0.3 - 1.2 mg/dL 0.8    ?Alkaline Phos 38 - 126 U/L 41    ?AST 15 - 41 U/L 20    ?ALT 0 - 44 U/L 14    ? ?Edinburgh Score: ?   ? View : No data to display.  ?  ?  ?  ? ? ? ?After visit meds:  ?Allergies  as of 05/14/2021   ?No Known Allergies ?  ? ?  ?Medication List  ?  ? ?TAKE these medications   ? ?ibuprofen 600 MG tablet ?Commonly known as: ADVIL ?Take 1 tablet (600 mg total) by mouth every 6 (six) hours. ?  ?iron polysaccharides 150 MG capsule ?Commonly known as: NIFEREX ?Take 1 capsule (150 mg total) by mouth every other day. ?  ?metroNIDAZOLE 500 MG tablet ?Commonly known as: FLAGYL ?Take 1 tablet (500 mg total) by mouth 2 (two) times daily. ?  ? ?  ? ? ? ?Discharge home in stable condition ?Infant Feeding: Breast ?Infant Disposition:home with mother ?Discharge instruction: per After Visit Summary and Postpartum booklet. ?Activity: Advance as tolerated. Pelvic rest for 6 weeks.  ?Diet: routine diet ?Future Appointments: ?Future Appointments  ?Date Time Provider [place]  ?06/12/2021  9:55 AM Griffin Basil, MD [facility]  ? ?Follow up Visit: ? Follow-up Information   ? ? Center for [employer] at [facility]. Schedule an  appointment as soon as possible for a visit in 4 week(s).   ?Specialty: Obstetrics and Gynecology ?Contact information: ?[place] ?[place] [address] ?[phone] ? ?  ?  ? ?  ?  ? ?  ? ?Message sent to [facility] by Dr Cy Blamer on 4/12 ? ?Please schedule this patient for a In person postpartum visit in 4 weeks with the following provider: Any provider. ?Additional Postpartum F/U: None   ?Low risk pregnancy complicated by:  None ?Delivery mode:  Vaginal, Spontaneous  ?Anticipated Birth Control:  Unsure ? ? ?05/14/2021 ?Hansel Feinstein, CNM ? ? ? ?

## 2021-05-14 ENCOUNTER — Other Ambulatory Visit: Payer: Self-pay

## 2021-05-14 LAB — CBC
HCT: 25.2 % — ABNORMAL LOW (ref 36.0–46.0)
Hemoglobin: 7.8 g/dL — ABNORMAL LOW (ref 12.0–15.0)
MCH: 22.4 pg — ABNORMAL LOW (ref 26.0–34.0)
MCHC: 31 g/dL (ref 30.0–36.0)
MCV: 72.4 fL — ABNORMAL LOW (ref 80.0–100.0)
Platelets: 188 10*3/uL (ref 150–400)
RBC: 3.48 MIL/uL — ABNORMAL LOW (ref 3.87–5.11)
RDW: 24.1 % — ABNORMAL HIGH (ref 11.5–15.5)
WBC: 12.5 10*3/uL — ABNORMAL HIGH (ref 4.0–10.5)
nRBC: 0.2 % (ref 0.0–0.2)

## 2021-05-14 LAB — SURGICAL PATHOLOGY

## 2021-05-14 MED ORDER — METHYLPREDNISOLONE SODIUM SUCC 125 MG IJ SOLR: 125.0000 mg | Freq: Once | INTRAMUSCULAR | Status: DC | PRN

## 2021-05-14 MED ORDER — SODIUM CHLORIDE 0.9 % IV SOLN
500.0000 mg | Freq: Once | INTRAVENOUS | Status: AC
  Administered 2021-05-14: 500 mg via INTRAVENOUS
  Filled 2021-05-14: qty 25

## 2021-05-14 MED ORDER — SODIUM CHLORIDE 0.9 % IV SOLN: INTRAVENOUS | Status: DC | PRN

## 2021-05-14 MED ORDER — SODIUM CHLORIDE 0.9 % IV BOLUS: 500.0000 mL | Freq: Once | INTRAVENOUS | Status: DC | PRN

## 2021-05-14 MED ORDER — EPINEPHRINE PF 1 MG/ML IJ SOLN
0.3000 mg | Freq: Once | INTRAMUSCULAR | Status: DC | PRN
  Filled 2021-05-14: qty 1

## 2021-05-14 MED ORDER — DIPHENHYDRAMINE HCL 50 MG/ML IJ SOLN: 25.0000 mg | Freq: Once | INTRAMUSCULAR | Status: DC | PRN

## 2021-05-14 MED ORDER — IBUPROFEN 600 MG PO TABS: 600.0000 mg | ORAL_TABLET | Freq: Four times a day (QID) | ORAL | 0 refills | Status: DC

## 2021-05-14 MED ORDER — ALBUTEROL SULFATE (2.5 MG/3ML) 0.083% IN NEBU: 2.5000 mg | INHALATION_SOLUTION | Freq: Once | RESPIRATORY_TRACT | Status: DC | PRN

## 2021-05-14 NOTE — Lactation Note (Signed)
This note was copied from a baby's chart. ?Lactation Consultation Note ? ?Patient Name: Leah Vargas ?Today's Date: 05/14/2021 ?Reason for consult: Follow-up assessment;1st time breastfeeding;Primapara;Term ?Age:31 hours ? ?LC in to visit with P20 Mom of term baby on day of discharge.  Baby is at 3% weight loss and has been exclusively breastfeeding until this am when she started feeding baby formula by bottle.  Mom tired and currently having an iron infusion running.  ? ?Talked to Mom about her goals with feeding.  Mom wants to breast and bottle feed.  Mom has a Spectra DEBP for home use.  ? ?Baby hasn't breast fed since 0530.  Talked to Mom about supporting h er milk supply with double pumping.   ? ?[place] set up DEBP and assisted Mom with her first pumping.  24 mm flanges fit well.  2 bins provided and parents taught to disassemble parts, wash, rinse and air dry.   ? ?Mom encouraged to ask for help prn for latching or pumping.  Mom aware of OP lactation support and encouraged Mom to call prn. ? ?Engorgement prevention and treatment reviewed.  ?Mom denies any questions currently.   ? ?Lactation Tools Discussed/Used ?Tools: Pump;Flanges;Bottle ?Flange Size: 24 ?Breast pump type: Double-Electric Breast Pump ?Pump Education: Setup, frequency, and cleaning;Milk Storage ?Reason for Pumping: Support milk supply ?Pumping frequency: Mom encouraged to pump if baby is supplemented ? ?Interventions ?Interventions: Breast feeding basics reviewed;Skin to skin;Breast massage;Hand express;Support pillows;DEBP;Education ? ?Discharge ?Discharge Education: Engorgement and breast care;Warning signs for feeding baby;Outpatient recommendation ?Pump: Personal (Spectra DEBP) ? ?Consult Status ?Consult Status: Complete ?Date: 05/14/21 ?Follow-up type: Call as needed ? ? ? ?Tilda Burrow E ?05/14/2021, 12:45 PM ? ? ? ?

## 2021-05-18 ENCOUNTER — Encounter: Payer: Self-pay | Admitting: *Deleted

## 2021-05-23 ENCOUNTER — Telehealth (HOSPITAL_COMMUNITY): Payer: Self-pay

## 2021-05-23 NOTE — Telephone Encounter (Signed)
"  Doing ok."  ? ?Patient asks if we can call her at a later time.  ? Heron Nay ?05/23/2021,1345 ?

## 2021-05-25 ENCOUNTER — Telehealth (HOSPITAL_COMMUNITY): Payer: Self-pay | Admitting: *Deleted

## 2021-05-25 NOTE — Telephone Encounter (Signed)
Left phone voicemail message. ? ?Odis Hollingshead, RN 05-25-2021 at 9:14am ?

## 2021-06-12 ENCOUNTER — Ambulatory Visit: Payer: BC Managed Care – PPO | Admitting: Obstetrics and Gynecology

## 2021-06-18 ENCOUNTER — Ambulatory Visit (INDEPENDENT_AMBULATORY_CARE_PROVIDER_SITE_OTHER): Payer: BC Managed Care – PPO

## 2021-06-18 NOTE — Progress Notes (Signed)
Post Partum Visit Note  Leah Vargas is a 31 y.o. G7P1001 female who presents for a postpartum visit. She is 5 weeks postpartum following a normal spontaneous vaginal delivery.  I have fully reviewed the prenatal and intrapartum course. The delivery was at 39.5 gestational weeks.  Anesthesia: epidural. Postpartum course has been uncomplicated. Baby is doing well. Baby is feeding by both breast and bottle - Similac 360 . Bleeding changing a maxi pad 3 times a day. Bowel function is normal. Bladder function is normal. Patient is not sexually active. Contraception method is none. Postpartum depression screening: negative.   The pregnancy intention screening data noted above was reviewed. Potential methods of contraception were discussed. The patient elected to proceed with No data recorded.   Edinburgh Postnatal Depression Scale - 06/18/21 0945       Edinburgh Postnatal Depression Scale:  In the Past 7 Days   I have been able to laugh and see the funny side of things. 0    I have looked forward with enjoyment to things. 0    I have blamed myself unnecessarily when things went wrong. 0    I have been anxious or worried for no good reason. 0    I have felt scared or panicky for no good reason. 0    Things have been getting on top of me. 0    I have been so unhappy that I have had difficulty sleeping. 0    I have felt sad or miserable. 0    I have been so unhappy that I have been crying. 0    The thought of harming myself has occurred to me. 0    Edinburgh Postnatal Depression Scale Total 0             Health Maintenance Due  Topic Date Due   TETANUS/TDAP  Never done   PAP SMEAR-Modifier  Never done   COVID-19 Vaccine (3 - Booster for Moderna series) 07/24/2019    The following portions of the patient's history were reviewed and updated as appropriate: allergies, current medications, past family history, past medical history, past social history, past surgical history, and  problem list.  Review of Systems Pertinent items are noted in HPI.  Objective:  BP 104/74   Pulse 81   Wt 162 lb (73.5 kg)   LMP 08/12/2020 (Exact Date)   Breastfeeding Yes   BMI 26.96 kg/m    General:  alert, cooperative, and no distress   Breasts:  not indicated  Lungs: Effort normal  Heart:  Regular rate  Abdomen: soft, non-tender; bowel sounds normal; no masses,  no organomegaly   Wound N/A  GU exam:  not indicated       Assessment:  1. Postpartum care and examination - Normal postpartum exam - Patient reports bleeding stopped, but started on Wednesday. Bleeding likely menses - Patient desires non-hormonal contraception but unsure of options. Reviewed options, patient considering Paragard. Information provided. Patient will schedule appointment as needed  Plan:   Essential components of care per ACOG recommendations:  1.  Mood and well being: Patient with negative depression screening today. Reviewed local resources for support.  - Patient tobacco use? No.   - hx of drug use? No.    2. Infant care and feeding:  -Patient currently breastmilk feeding? Yes. Discussed returning to work and pumping. Reviewed importance of draining breast regularly to support lactation.  -Social determinants of health (SDOH) reviewed in EPIC. No concerns  3. Sexuality, contraception  and birth spacing - Patient does not want a pregnancy in the next year.  Desired family size is 2 children.  - Reviewed reproductive life planning. Reviewed contraceptive methods based on pt preferences and effectiveness.  Patient desired Female Condom today.   - Discussed birth spacing of 18 months  4. Sleep and fatigue -Encouraged family/partner/community support of 4 hrs of uninterrupted sleep to help with mood and fatigue  5. Physical Recovery  - Discussed patients delivery and complications. She describes her labor as good. - Patient had a Vaginal, no problems at delivery. Patient had a 2nd degree  laceration. Perineal healing reviewed. Patient expressed understanding - Patient has urinary incontinence? No. - Patient is safe to resume physical and sexual activity  6.  Health Maintenance - HM due items addressed Yes - Last pap smear No results found for: DIAGPAP Pap smear not done at today's visit.  -Breast Cancer screening indicated? No.   7. Chronic Disease/Pregnancy Condition follow up: None  - PCP follow up  Brand Males, CNM Center for [facility], [facility]

## 2021-06-26 ENCOUNTER — Ambulatory Visit: Payer: BC Managed Care – PPO | Admitting: Certified Nurse Midwife

## 2021-06-30 ENCOUNTER — Ambulatory Visit: Payer: BC Managed Care – PPO | Admitting: Family Medicine

## 2022-02-01 NOTE — L&D Delivery Note (Signed)
OB/GYN Faculty Practice Delivery Note  Leah Vargas is a 32 y.o. G2P1001 s/p SVD at [redacted]w[redacted]d. She was admitted for eIOL.   ROM: 0h 41m with clear fluid GBS Status:  Negative/-- (10/09 1137) Maximum Maternal Temperature: 98.59F  Labor Progress: Initial SVE: cl/th/hi. Ripened with dual Cytotec and foley balloon. Epidural. Augmentation with AROM. She rapidly progressed to complete. Course complicated but recurrent shallow late decels and minimal variability.   Delivery Date/Time: 12/04/22 @0643  Delivery: Called to room and patient was complete and pushing. Head delivered ROA. No nuchal cord present. Shoulder and body delivered in usual fashion. Infant with spontaneous cry, placed on mother's abdomen, dried and stimulated. Cord clamped x 2 after 1-minute delay, and cut by FOB. Cord blood drawn. Placenta delivered spontaneously with large clots attached consistent with placental abruption. Fundus firm with bimanual massage, two finger lower uterine sweep, Methergine IM and Pitocin. Labia, perineum, vagina, and cervix inspected with 2nd degree perineal laceration. Repaired with 3.0 vicryl in standard fashion.   Baby Weight: 3570g  Placenta: 3 vessel, intact. Sent to L&D Complications: None Lacerations: 2nd degree perineal laceration. Repaired with 3.0 vicryl in standard fashion. EBL: 211 mL Analgesia: Epidural & Local Lidocaine   Infant:  APGAR (1 MIN): 8  APGAR (5 MINS): 9   Wyn Forster, MD OB Family Medicine Fellow, [facility] for [facility], [facility] 12/04/2022, 7:23 AM

## 2022-03-31 ENCOUNTER — Telehealth: Payer: Self-pay

## 2022-03-31 NOTE — Telephone Encounter (Signed)
-----   Message from Maurine Minister, [place] sent at 03/31/2022  3:48 PM EST ----- Regarding: NOB She has questions about some testing regarding her having sickle cell.  She is scheduled for NOB in April.  I told her that I would have someone to call her.

## 2022-03-31 NOTE — Telephone Encounter (Signed)
Patient called requesting to have Genetic testing before 12 weeks. Patient is scheduled for a NOB appointment in April. Advised patient to send a message to the provider. Understanding was voiced. Matei Magnone l Elga Santy, CMA

## 2022-04-01 ENCOUNTER — Other Ambulatory Visit: Payer: Self-pay | Admitting: Family Medicine

## 2022-04-01 DIAGNOSIS — D573 Sickle-cell trait: Secondary | ICD-10-CM

## 2022-04-01 NOTE — Progress Notes (Signed)
Referral to MFM for [facility] due to sickle cell trait. Her husband (FOB) has sickle cell disease.

## 2022-04-02 ENCOUNTER — Encounter: Payer: Self-pay | Admitting: General Practice

## 2022-04-08 ENCOUNTER — Ambulatory Visit: Payer: BC Managed Care – PPO

## 2022-04-20 ENCOUNTER — Ambulatory Visit: Payer: BC Managed Care – PPO

## 2022-04-29 ENCOUNTER — Ambulatory Visit: Payer: BC Managed Care – PPO | Attending: Obstetrics and Gynecology

## 2022-04-29 ENCOUNTER — Encounter (HOSPITAL_COMMUNITY): Payer: Self-pay | Admitting: Obstetrics & Gynecology

## 2022-04-29 ENCOUNTER — Inpatient Hospital Stay (HOSPITAL_COMMUNITY)
Admission: AD | Admit: 2022-04-29 | Discharge: 2022-04-29 | Disposition: A | Discharge status: Home / Self Care | Payer: BC Managed Care – PPO | Attending: Obstetrics & Gynecology | Admitting: Obstetrics & Gynecology

## 2022-04-29 ENCOUNTER — Other Ambulatory Visit: Payer: Self-pay | Admitting: Advanced Practice Midwife

## 2022-04-29 DIAGNOSIS — Z3A08 8 weeks gestation of pregnancy: Secondary | ICD-10-CM | POA: Diagnosis not present

## 2022-04-29 DIAGNOSIS — O99019 Anemia complicating pregnancy, unspecified trimester: Secondary | ICD-10-CM

## 2022-04-29 DIAGNOSIS — D573 Sickle-cell trait: Secondary | ICD-10-CM

## 2022-04-29 DIAGNOSIS — O352XX0 Maternal care for (suspected) hereditary disease in fetus, not applicable or unspecified: Secondary | ICD-10-CM

## 2022-04-29 DIAGNOSIS — O219 Vomiting of pregnancy, unspecified: Secondary | ICD-10-CM | POA: Diagnosis not present

## 2022-04-29 LAB — COMPREHENSIVE METABOLIC PANEL
ALT: 11 U/L (ref 0–44)
AST: 18 U/L (ref 15–41)
Albumin: 3.7 g/dL (ref 3.5–5.0)
Alkaline Phosphatase: 49 U/L (ref 38–126)
Anion gap: 13 (ref 5–15)
BUN: 6 mg/dL (ref 6–20)
CO2: 20 mmol/L — ABNORMAL LOW (ref 22–32)
Calcium: 9.2 mg/dL (ref 8.9–10.3)
Chloride: 102 mmol/L (ref 98–111)
Creatinine, Ser: 0.77 mg/dL (ref 0.44–1.00)
GFR, Estimated: >60 mL/min (ref >60)
Glucose, Bld: 62 mg/dL — ABNORMAL LOW (ref 70–99)
Potassium: 3.2 mmol/L — ABNORMAL LOW (ref 3.5–5.1)
Sodium: 135 mmol/L (ref 135–145)
Total Bilirubin: 0.7 mg/dL (ref 0.3–1.2)
Total Protein: 7.3 g/dL (ref 6.5–8.1)

## 2022-04-29 LAB — CBC WITH DIFFERENTIAL/PLATELET
Abs Immature Granulocytes: 0.01 10*3/uL (ref 0.00–0.07)
Basophils Absolute: 0 10*3/uL (ref 0.0–0.1)
Basophils Relative: 0 %
Eosinophils Absolute: 0.1 10*3/uL (ref 0.0–0.5)
Eosinophils Relative: 2 %
HCT: 37.4 % (ref 36.0–46.0)
Hemoglobin: 12.6 g/dL (ref 12.0–15.0)
Immature Granulocytes: 0 %
Lymphocytes Relative: 22 %
Lymphs Abs: 1.2 10*3/uL (ref 0.7–4.0)
MCH: 27.4 pg (ref 26.0–34.0)
MCHC: 33.7 g/dL (ref 30.0–36.0)
MCV: 81.3 fL (ref 80.0–100.0)
Monocytes Absolute: 0.5 10*3/uL (ref 0.1–1.0)
Monocytes Relative: 9 %
Neutro Abs: 3.7 10*3/uL (ref 1.7–7.7)
Neutrophils Relative %: 67 %
Platelets: 244 10*3/uL (ref 150–400)
RBC: 4.6 MIL/uL (ref 3.87–5.11)
RDW: 15.1 % (ref 11.5–15.5)
WBC: 5.6 10*3/uL (ref 4.0–10.5)
nRBC: 0 % (ref 0.0–0.2)

## 2022-04-29 MED ORDER — SODIUM CHLORIDE 0.9 % IV SOLN
25.0000 mg | Freq: Four times a day (QID) | INTRAVENOUS | Status: DC | PRN
  Administered 2022-04-29: 25 mg via INTRAVENOUS
  Filled 2022-04-29: qty 1

## 2022-04-29 MED ORDER — LACTATED RINGERS IV BOLUS
1000.0000 mL | Freq: Once | INTRAVENOUS | Status: AC
  Administered 2022-04-29: 1000 mL via INTRAVENOUS

## 2022-04-29 MED ORDER — PROMETHAZINE HCL 25 MG PO TABS: 25.0000 mg | ORAL_TABLET | Freq: Four times a day (QID) | ORAL | 1 refills | Status: DC | PRN

## 2022-04-29 MED ORDER — ONDANSETRON 8 MG PO TBDP: 8.0000 mg | ORAL_TABLET | Freq: Three times a day (TID) | ORAL | 0 refills | Status: DC | PRN

## 2022-04-29 MED ORDER — PROMETHAZINE HCL 25 MG PO TABS: 25.0000 mg | ORAL_TABLET | Freq: Four times a day (QID) | ORAL | 0 refills | Status: DC | PRN

## 2022-04-29 NOTE — MAU Note (Signed)
Leah Vargas is a 32 y.o. at [redacted]w[redacted]d here in MAU reporting: started vomiting 2 wks ago, has gotten more consistent last 5 days. Tried several things-not helping, no meds. No pain or bleeding. Not peeing much, only once today, very concentrated. Very tired.  Onset of complaint: 2 wks ago Pain score: none Vitals:   04/29/22 1454  BP: 105/82  Pulse: 80  Resp: 17  Temp: 98.9 F (37.2 C)  SpO2: 98%      Lab orders placed from triage:  urine...pt unable to go at this time.

## 2022-04-29 NOTE — MAU Provider Note (Signed)
History     CSN: [id number]  Arrival date and time: 04/29/22 1427   Event Date/Time   First Provider Initiated Contact with Patient 04/29/22 1531      Chief Complaint  Patient presents with   Emesis   Fatigue   Leah Vargas is a 32 y.o. G2P1001 at [redacted]w[redacted]d who presents today with nausea and vomiting. She reports that this has been an ongoing issue with this pregnancy. She has not been given any medications for this yet.   Emesis  This is a new problem. The current episode started 1 to 4 weeks ago. The problem occurs more than 10 times per day. The problem has been unchanged. The emesis has an appearance of stomach contents. There has been no fever. Risk factors: pregnancy. She has tried nothing for the symptoms.    OB History     Gravida  2   Para  1   Term  1   Preterm      AB      Living  1      SAB      IAB      Ectopic      Multiple  0   Live Births  1           Past Medical History:  Diagnosis Date   Medical history non-contributory     Past Surgical History:  Procedure Laterality Date   NO PAST SURGERIES      Family History  Problem Relation Age of Onset   Hypertension Mother    Healthy Father    Asthma Sister     Social History   Tobacco Use   Smoking status: Never    Passive exposure: Never   Smokeless tobacco: Never  Vaping Use   Vaping Use: Never used  Substance Use Topics   Alcohol use: Not Currently   Drug use: Never    Allergies: No Known Allergies  Medications Prior to Admission  Medication Sig Dispense Refill Last Dose   ibuprofen (ADVIL) 600 MG tablet Take 1 tablet (600 mg total) by mouth every 6 (six) hours. 30 tablet 0    iron polysaccharides (NIFEREX) 150 MG capsule Take 1 capsule (150 mg total) by mouth every other day. (Patient not taking: Reported on 06/18/2021) 30 capsule 1    metroNIDAZOLE (FLAGYL) 500 MG tablet Take 1 tablet (500 mg total) by mouth 2 (two) times daily. (Patient not taking:  Reported on 06/18/2021) 14 tablet 0     Review of Systems  Gastrointestinal:  Positive for vomiting.  All other systems reviewed and are negative.  Physical Exam   Blood pressure 111/63, pulse 79, temperature 98.9 F (37.2 C), temperature source Oral, resp. rate 17, height 5\' 5"  (1.651 m), weight 79.4 kg, last menstrual period 02/26/2022, SpO2 100 %, currently breastfeeding.  Physical Exam Vitals and nursing note reviewed.  Constitutional:      General: She is not in acute distress. HENT:     Head: Normocephalic.  Cardiovascular:     Rate and Rhythm: Normal rate.  Pulmonary:     Effort: Pulmonary effort is normal.  Abdominal:     Palpations: Abdomen is soft.     Tenderness: There is no abdominal tenderness.  Skin:    General: Skin is warm and dry.  Neurological:     Mental Status: She is alert and oriented to person, place, and time.  Psychiatric:        Mood and Affect: Mood normal.  Behavior: Behavior normal.    Results for orders placed or performed during the hospital encounter of 04/29/22 (from the past 24 hour(s))  CBC with Differential/Platelet     Status: None   Collection Time: 04/29/22  4:15 PM  Result Value Ref Range   WBC 5.6 4.0 - 10.5 K/uL   RBC 4.60 3.87 - 5.11 MIL/uL   Hemoglobin 12.6 12.0 - 15.0 g/dL   HCT 37.4 36.0 - 46.0 %   MCV 81.3 80.0 - 100.0 fL   MCH 27.4 26.0 - 34.0 pg   MCHC 33.7 30.0 - 36.0 g/dL   RDW 15.1 11.5 - 15.5 %   Platelets 244 150 - 400 K/uL   nRBC 0.0 0.0 - 0.2 %   Neutrophils Relative % 67 %   Neutro Abs 3.7 1.7 - 7.7 K/uL   Lymphocytes Relative 22 %   Lymphs Abs 1.2 0.7 - 4.0 K/uL   Monocytes Relative 9 %   Monocytes Absolute 0.5 0.1 - 1.0 K/uL   Eosinophils Relative 2 %   Eosinophils Absolute 0.1 0.0 - 0.5 K/uL   Basophils Relative 0 %   Basophils Absolute 0.0 0.0 - 0.1 K/uL   Immature Granulocytes 0 %   Abs Immature Granulocytes 0.01 0.00 - 0.07 K/uL   MAU Course  Procedures  MDM Patient has had 2L of LR,  25mg  phenergan  She is resting comfortably and tolerating PO at this time   Assessment and Plan   1. Nausea/vomiting in pregnancy   2. [redacted] weeks gestation of pregnancy    DC home in stable condition  1st Trimester precautions  RX: phenergan 25mg  PRN #30, Zofran 8mg  ODT PRN #30  Return to MAU as needed FU with OB as planned   [facility] [place] Follow up.   Specialty: Obstetrics and Gynecology Why: As scheduled Contact information: [place] [phone] [place] DNP, CNM  04/29/22  7:14 PM

## 2022-04-29 NOTE — MAU Note (Signed)
Lab called, chem tube had clotted, needs to be recollected, phleb called.

## 2022-04-29 NOTE — Progress Notes (Signed)
[facility] at [facility] [address], [address] Phone:  [phone]   Fax:  [phone]    Name: Leah Vargas Indication: Risk for Sickle Cell disease and Sickle-Hemoglobin C disease in the current pregnancy  DOB: 06/20/1990 Age: 32 y.o.   EDD: 12/03/2022 LMP: 02/26/2022 Referring Provider:  Truett Mainland, DO  EGA: [redacted]w[redacted]d Genetic Counselor: Staci Righter, MS, CGC  OB [id number] Date of Appointment: 04/29/2022  Accompanied by: Kristine Linea Face to Face Time: 30 Minutes   Medical & Family History:  Denies personal history of diabetes, high blood pressure, thyroid conditions, and seizures. Denies bleeding, infections, and fevers in this pregnancy. Denies using tobacco, alcohol, or street drugs in this pregnancy.  Maternal ethnicity reported as Black and paternal ethnicity reported as Black. Denies consanguinity. Leah Vargas denied a family history of chromosome conditions, intellectual disability, autism, birth defects, bone/skeletal disorders, blindness, deafness, neuromuscular disorders, still births, early infant deaths, and 3 or more pregnancy losses for one person on her prenatal intake questionnaire.     Genetic Counseling:   Risk for Sickle Cell Disease (Hb S/S) & Sickle-Hemoglobin C Disease (Hb S/C). Genetic counseling met with Leah Vargas on 12/03/2020 in her first pregnancy to discuss the risk for that pregnancy to be affected with Sickle Cell Disease (Hb S/S) or Sickle-Hemoglobin C Disease (HbS/C). 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 Leah Vargas continued her first pregnancy and today reports to genetic counseling that her daughter, Leah Vargas, is overall doing  well.  Leah Vargas was referred for a second genetic counseling consultation because she is [redacted]w[redacted]d pregnant with an estimated due date of 12/03/2022 and she desires diagnostic testing for Sickle Cell Disease and Sickle-Hemoglobin C Disease in the current pregnancy. To review, the risks for the pregnancy are as follows:  25% risk for the current pregnancy to be affected with Sickle Cell Disease (Hb S/S) 25% risk for the current pregnancy to be affected with Sickle-Hemoglobin C Disease (Hb S/C) 25% chance for the current pregnancy to be a carrier for Sickle Cell Disease (Hb A/S) 25% chance for the current pregnancy to be a carrier for Sickle-Hemoglobin C Disease (Hb A/C).   Given the risk the current pregnancy has to be affected, genetic counseling offered Leah Vargas CVS and Amniocentesis for prenatal diagnosis. CVS involves the withdrawal of a small amount of chorionic villi (tissue from the developing placenta). Amniocentesis involves the removal of a small amount of amniotic fluid from the sac surrounding the pregnancy. Ultrasound guidance is used throughout both procedures. Possible procedural difficulties and complications that can arise with these procedures include maternal infection, cramping, bleeding, fluid leakage, and/or pregnancy loss. The risk for pregnancy loss with a CVS/amniocentesis is 1/500. Per the [employer] (ACOG) Practice Bulletin 162, all pregnant women should be offered prenatal assessment for aneuploidy by diagnostic testing regardless of maternal age or other risk factors. If indicated, genetic testing that could be ordered on a CVS/amniocentesis sample includes a fetal karyotype, fetal microarray, and testing for specific syndromes such as Sickle Cell Disease and Sickle-Hemoglobin C Disease. Leah Vargas accepted CVS for prenatal diagnosis and will be [facility] this [facility] around  [redacted] weeks gestation.      Patient Plan:  Proceed with: CVS for prenatal diagnosis. Genetic testing will be performed by [facility]. Genetic testing to be ordered includes fetal chromosome analysis, fetal sickle cell testing, and maternal cell contamination studies.  ICD-10 codes: O99.019, D57.3, [id number] CPT codes: I1372092, [id number], [id number], [id number], [id number], [id number] Informed consent was obtained. All questions were answered.  Declined: Amniocentesis   Thank you for sharing in the care of [place] with [place].  Please do not hesitate to contact us if you have any questions.  Staci Righter, MS, [facility]

## 2022-05-04 ENCOUNTER — Other Ambulatory Visit: Payer: Self-pay | Admitting: *Deleted

## 2022-05-04 DIAGNOSIS — O99019 Anemia complicating pregnancy, unspecified trimester: Secondary | ICD-10-CM

## 2022-05-05 ENCOUNTER — Other Ambulatory Visit: Payer: Self-pay | Admitting: *Deleted

## 2022-05-05 DIAGNOSIS — O99019 Anemia complicating pregnancy, unspecified trimester: Secondary | ICD-10-CM

## 2022-05-13 ENCOUNTER — Ambulatory Visit (INDEPENDENT_AMBULATORY_CARE_PROVIDER_SITE_OTHER): Payer: BC Managed Care – PPO | Admitting: Family Medicine

## 2022-05-13 ENCOUNTER — Other Ambulatory Visit (INDEPENDENT_AMBULATORY_CARE_PROVIDER_SITE_OTHER): Payer: BC Managed Care – PPO

## 2022-05-13 ENCOUNTER — Other Ambulatory Visit (HOSPITAL_COMMUNITY)
Admission: RE | Admit: 2022-05-13 | Discharge: 2022-05-13 | Disposition: A | Discharge status: Home / Self Care | Payer: BC Managed Care – PPO | Source: Ambulatory Visit | Attending: Family Medicine | Admitting: Family Medicine

## 2022-05-13 VITALS — BP 102/65 | HR 78 | Wt 167.0 lb

## 2022-05-13 DIAGNOSIS — D509 Iron deficiency anemia, unspecified: Secondary | ICD-10-CM

## 2022-05-13 DIAGNOSIS — O3680X0 Pregnancy with inconclusive fetal viability, not applicable or unspecified: Secondary | ICD-10-CM

## 2022-05-13 DIAGNOSIS — Z3482 Encounter for supervision of other normal pregnancy, second trimester: Secondary | ICD-10-CM | POA: Diagnosis not present

## 2022-05-13 DIAGNOSIS — Z1339 Encounter for screening examination for other mental health and behavioral disorders: Secondary | ICD-10-CM

## 2022-05-13 DIAGNOSIS — O0990 Supervision of high risk pregnancy, unspecified, unspecified trimester: Secondary | ICD-10-CM

## 2022-05-13 DIAGNOSIS — D573 Sickle-cell trait: Secondary | ICD-10-CM | POA: Diagnosis present

## 2022-05-13 DIAGNOSIS — Z3A11 11 weeks gestation of pregnancy: Secondary | ICD-10-CM

## 2022-05-13 DIAGNOSIS — O99011 Anemia complicating pregnancy, first trimester: Secondary | ICD-10-CM | POA: Diagnosis not present

## 2022-05-13 DIAGNOSIS — Z3A10 10 weeks gestation of pregnancy: Secondary | ICD-10-CM | POA: Diagnosis not present

## 2022-05-13 MED ORDER — ONDANSETRON 4 MG PO TBDP: 4.0000 mg | ORAL_TABLET | Freq: Four times a day (QID) | ORAL | 1 refills | Status: DC | PRN

## 2022-05-13 MED ORDER — METOCLOPRAMIDE HCL 10 MG PO TABS: 10.0000 mg | ORAL_TABLET | Freq: Four times a day (QID) | ORAL | 1 refills | Status: DC | PRN

## 2022-05-13 NOTE — Progress Notes (Signed)
Subjective:  Oluwatamilore Omowumi Yankey is a G2P1001 [redacted]w[redacted]d being seen today for her first obstetrical visit.  Her obstetrical history is significant for  prior SVD . She has a history of Sickle cell trait. Her partner has Noble trait as well. She has seen genetic counseling and has CVS scheduled. Patient does intend to breast feed. Pregnancy history fully reviewed.  Patient reports nausea and vomiting.  BP 102/65   Pulse 78   Wt 167 lb (75.8 kg)   LMP 02/26/2022 (Exact Date)   BMI 27.79 kg/m   HISTORY: OB History  Gravida Para Term Preterm AB Living  2 1 1     1   SAB IAB Ectopic Multiple Live Births        0 1    # Outcome Date GA Lbr Len/2nd Weight Sex Delivery Anes PTL Lv  2 Current           1 Term 05/13/21 [redacted]w[redacted]d 02:49 / 02:11 7 lb 1.6 oz (3.22 kg) F Vag-Spont EPI  LIV    Past Medical History:  Diagnosis Date   Medical history non-contributory     Past Surgical History:  Procedure Laterality Date   NO PAST SURGERIES      Family History  Problem Relation Age of Onset   Hypertension Mother    Healthy Father    Asthma Sister      Exam  BP 102/65   Pulse 78   Wt 167 lb (75.8 kg)   LMP 02/26/2022 (Exact Date)   BMI 27.79 kg/m   Chaperone present during exam  CONSTITUTIONAL: Well-developed, well-nourished female in no acute distress.  HENT:  Normocephalic, atraumatic, External right and left ear normal. Oropharynx is clear and moist EYES: Conjunctivae and EOM are normal. Pupils are equal, round, and reactive to light. No scleral icterus.  NECK: Normal range of motion, supple, no masses.  Normal thyroid.  CARDIOVASCULAR: Normal heart rate noted, regular rhythm RESPIRATORY: Clear to auscultation bilaterally. Effort and breath sounds normal, no problems with respiration noted. BREASTS: declines ABDOMEN: Soft, normal bowel sounds, no distention noted.  No tenderness, rebound or guarding.  PELVIC: patient declines today MUSCULOSKELETAL: Normal range of motion. No  tenderness.  No cyanosis, clubbing, or edema.  2+ distal pulses. SKIN: Skin is warm and dry. No rash noted. Not diaphoretic. No erythema. No pallor. NEUROLOGIC: Alert and oriented to person, place, and time. Normal reflexes, muscle tone coordination. No cranial nerve deficit noted. PSYCHIATRIC: Normal mood and affect. Normal behavior. Normal judgment and thought content.    Assessment:    Pregnancy: G2P1001 Patient Active Problem List   Diagnosis Date Noted   Supervision of high risk pregnancy, antepartum 05/13/2022   Sickle-cell trait 01/29/2021   Iron deficiency anemia during pregnancy 11/11/2020   Elevated lipase 10/25/2020   Hypokalemia 10/25/2020   Hyperemesis 10/23/2020      Plan:   1. Pregnancy with uncertain fetal viability, single or unspecified fetus - US OB Limited; Future  2. Supervision of high risk pregnancy, antepartum - CBC/D/Plt+RPR+Rh+ABO+RubIgG... - Culture, OB Urine - [place] MFM OB DETAIL +14 WK; Future - CH[facility] AMB BABYSCRIPTS OPT IN - GC/Chlamydia probe amp ([place])not at [facility]  3. Sickle-cell trait - CBC/D/Plt+RPR+Rh+ABO+RubIgG... - Culture, OB Urine - [place] MFM OB DETAIL +14 WK; Future - CHL AMB BABYSCRIPTS OPT IN - GC/Chlamydia probe amp ([place])not at [facility]  4. Iron deficiency anemia during pregnancy     Problem list reviewed and updated. 75% of 30 min visit spent on counseling  and coordination of care.     Levie Heritage 05/13/2022

## 2022-05-13 NOTE — Addendum Note (Signed)
Addended by: Lorelle Gibbs L on: 05/13/2022 11:19 AM   Modules accepted: Orders

## 2022-05-14 LAB — CBC/D/PLT+RPR+RH+ABO+RUBIGG...
Antibody Screen: NEGATIVE
Basophils Absolute: 0 10*3/uL (ref 0.0–0.2)
Basos: 1 %
EOS (ABSOLUTE): 0.1 10*3/uL (ref 0.0–0.4)
Eos: 2 %
HCV Ab: NONREACTIVE
HIV Screen 4th Generation wRfx: NONREACTIVE
Hematocrit: 39.1 % (ref 34.0–46.6)
Hemoglobin: 12.7 g/dL (ref 11.1–15.9)
Hepatitis B Surface Ag: NEGATIVE
Immature Grans (Abs): 0 10*3/uL (ref 0.0–0.1)
Immature Granulocytes: 0 %
Lymphocytes Absolute: 1.4 10*3/uL (ref 0.7–3.1)
Lymphs: 24 %
MCH: 27 pg (ref 26.6–33.0)
MCHC: 32.5 g/dL (ref 31.5–35.7)
MCV: 83 fL (ref 79–97)
Monocytes Absolute: 0.3 10*3/uL (ref 0.1–0.9)
Monocytes: 5 %
Neutrophils Absolute: 4.1 10*3/uL (ref 1.4–7.0)
Neutrophils: 68 %
Platelets: 291 10*3/uL (ref 150–450)
RBC: 4.71 x10E6/uL (ref 3.77–5.28)
RDW: 16.4 % — ABNORMAL HIGH (ref 11.7–15.4)
RPR Ser Ql: NONREACTIVE
Rh Factor: POSITIVE
Rubella Antibodies, IGG: 32.7 index (ref >0.99)
WBC: 5.9 10*3/uL (ref 3.4–10.8)

## 2022-05-14 LAB — HCV INTERPRETATION

## 2022-05-18 LAB — GC/CHLAMYDIA PROBE AMP (~~LOC~~) NOT AT ARMC
Chlamydia: NEGATIVE
Comment: NEGATIVE
Comment: NORMAL
Neisseria Gonorrhea: NEGATIVE

## 2022-05-21 ENCOUNTER — Other Ambulatory Visit: Payer: Self-pay | Admitting: Obstetrics and Gynecology

## 2022-05-21 ENCOUNTER — Ambulatory Visit: Payer: BC Managed Care – PPO | Attending: Obstetrics and Gynecology

## 2022-05-21 ENCOUNTER — Ambulatory Visit: Payer: BC Managed Care – PPO | Admitting: *Deleted

## 2022-05-21 ENCOUNTER — Ambulatory Visit: Payer: BC Managed Care – PPO

## 2022-05-21 ENCOUNTER — Encounter: Payer: Self-pay | Admitting: *Deleted

## 2022-05-21 VITALS — BP 106/51 | HR 78

## 2022-05-21 DIAGNOSIS — Z3A12 12 weeks gestation of pregnancy: Secondary | ICD-10-CM | POA: Diagnosis not present

## 2022-05-21 DIAGNOSIS — Z363 Encounter for antenatal screening for malformations: Secondary | ICD-10-CM | POA: Insufficient documentation

## 2022-05-21 DIAGNOSIS — Z832 Family history of diseases of the blood and blood-forming organs and certain disorders involving the immune mechanism: Secondary | ICD-10-CM

## 2022-05-21 DIAGNOSIS — D573 Sickle-cell trait: Secondary | ICD-10-CM

## 2022-05-21 DIAGNOSIS — O99011 Anemia complicating pregnancy, first trimester: Secondary | ICD-10-CM | POA: Insufficient documentation

## 2022-05-21 DIAGNOSIS — O0990 Supervision of high risk pregnancy, unspecified, unspecified trimester: Secondary | ICD-10-CM

## 2022-05-21 DIAGNOSIS — O358XX0 Maternal care for other (suspected) fetal abnormality and damage, not applicable or unspecified: Secondary | ICD-10-CM | POA: Diagnosis not present

## 2022-05-21 NOTE — Addendum Note (Signed)
Addended by: Teena Dunk on: 05/21/2022 10:53 AM   Modules accepted: Orders

## 2022-05-21 NOTE — Addendum Note (Signed)
Addended by: Teena Dunk on: 05/21/2022 10:42 AM   Modules accepted: Orders

## 2022-05-24 ENCOUNTER — Telehealth: Payer: Self-pay | Admitting: *Deleted

## 2022-05-25 ENCOUNTER — Telehealth: Payer: Self-pay | Admitting: Genetics

## 2022-05-25 LAB — MCC TRACKING

## 2022-05-25 LAB — SPECIMEN STATUS REPORT

## 2022-05-25 LAB — CHROMOSOME, CHORIONIC VILLUS
Cells Analyzed: 0
Cells Counted: 0
Cells Karyotyped: 0

## 2022-05-25 NOTE — Telephone Encounter (Signed)
Patient returned genetic counseling's phone call. We discussed that unfortunately no villi (tissues) were identified by the laboratory in her CVS sample and Dr. Judeth Cornfield recommends an amniocentesis for prenatal diagnosis. Patient was understandably disappointed to hear about the CVS sample and stated she would need to think about if she would like to proceed with amniocentesis. She was instructed to call the [facility] in the event she decides to proceed with amniocentesis.

## 2022-05-25 NOTE — Telephone Encounter (Signed)
Called Leah Vargas to inform her that unfortunately no villi (tissues) were identified by the laboratory in her CVS sample and Dr. Judeth Cornfield recommends amniocentesis for prenatal diagnosis. Left voicemail with [facility] call back number.

## 2022-05-27 ENCOUNTER — Encounter: Payer: Self-pay | Admitting: Family Medicine

## 2022-05-28 LAB — SPECIMEN STATUS REPORT

## 2022-05-28 LAB — SICKLE CELL, FETAL ANALYSIS

## 2022-06-03 ENCOUNTER — Other Ambulatory Visit: Payer: Self-pay | Admitting: Obstetrics and Gynecology

## 2022-06-03 ENCOUNTER — Telehealth: Payer: Self-pay

## 2022-06-03 DIAGNOSIS — Z148 Genetic carrier of other disease: Secondary | ICD-10-CM

## 2022-06-08 IMAGING — US US OB COMP LESS 14 WK
1 series · 15 of 28 positions shown · non-contrast
Comparison: None.

CLINICAL DATA: Initial evaluation for early pregnancy, assess
viability. Hyperemesis.

EXAM:
OBSTETRIC <14 WK ULTRASOUND
TECHNIQUE: Transabdominal ultrasound was performed for evaluation of the
gestation as well as the maternal uterus and adnexal regions.

[Series 1: us ob comp less 14 wk · 15 of 31 slices shown]
[im 1/31]
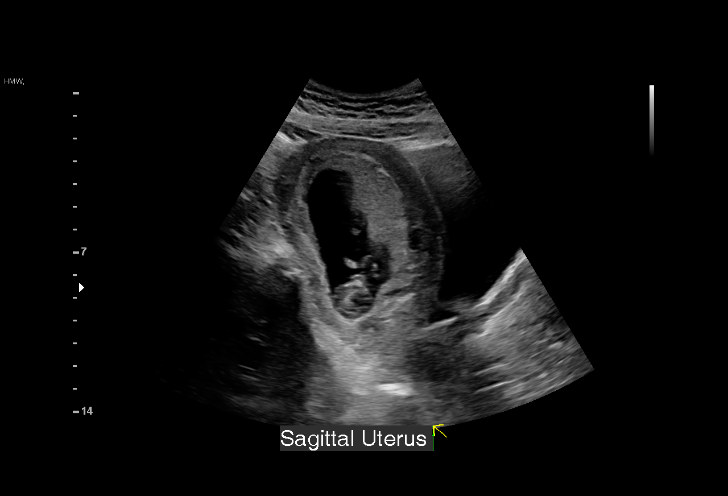
[im 3/31]
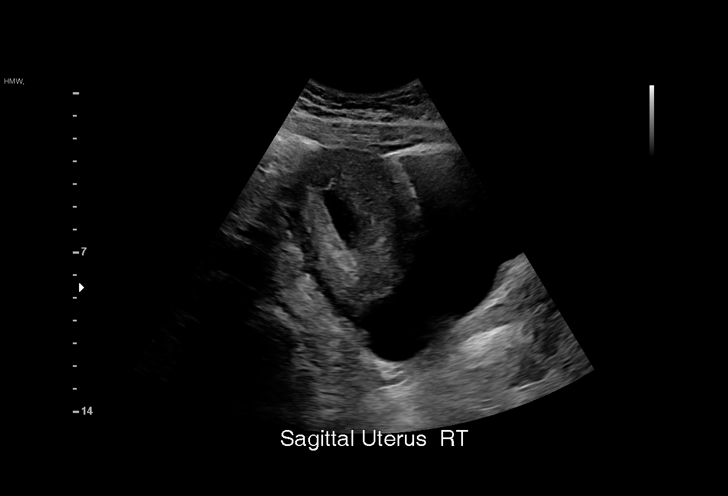
[im 5/31]
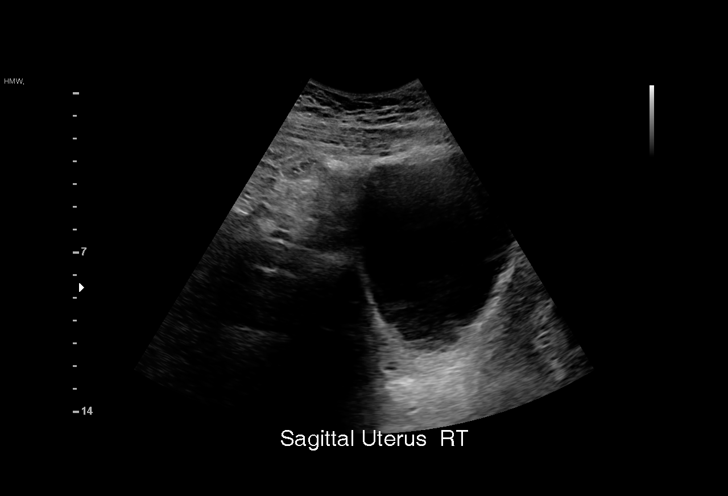
[im 7/31]
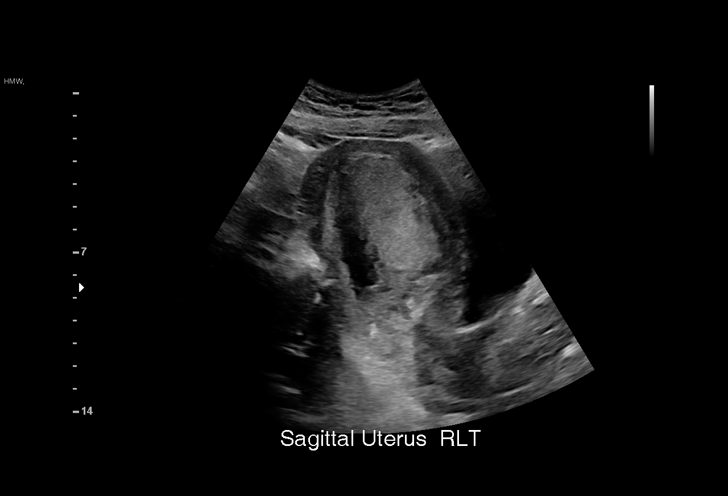
[im 9/31]
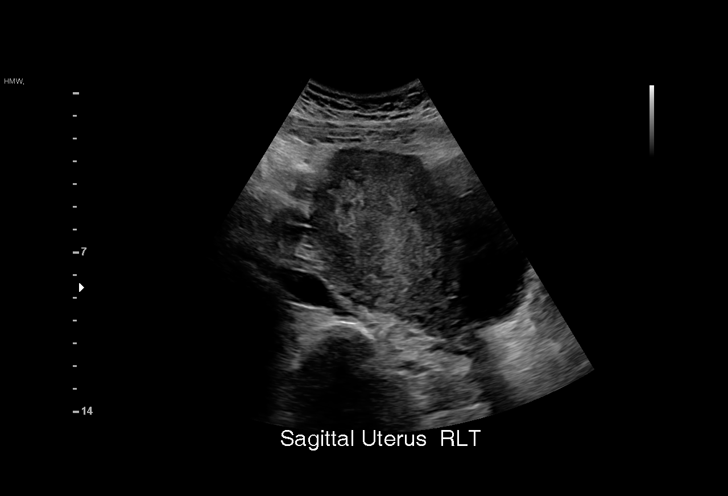
[im 12/31]
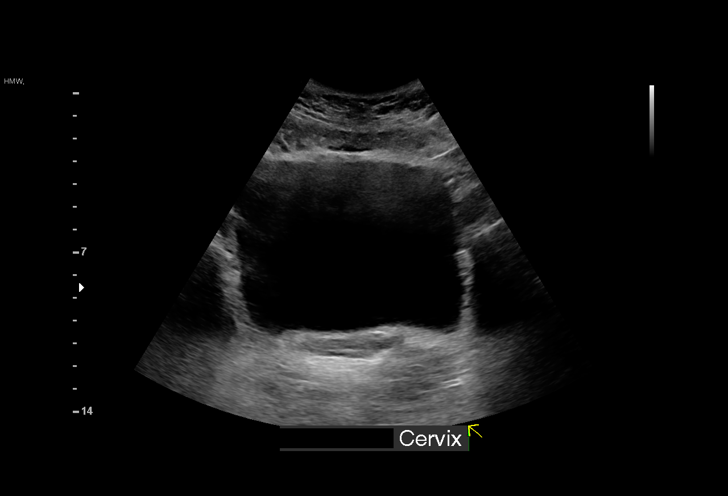
[im 14/31]
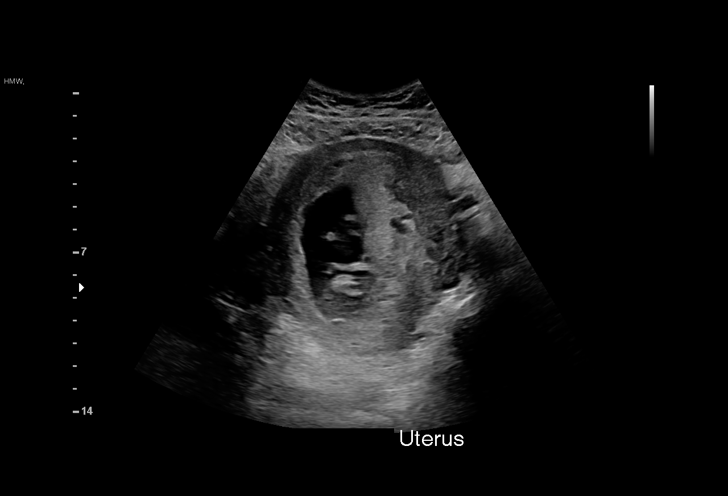
[im 16/31]
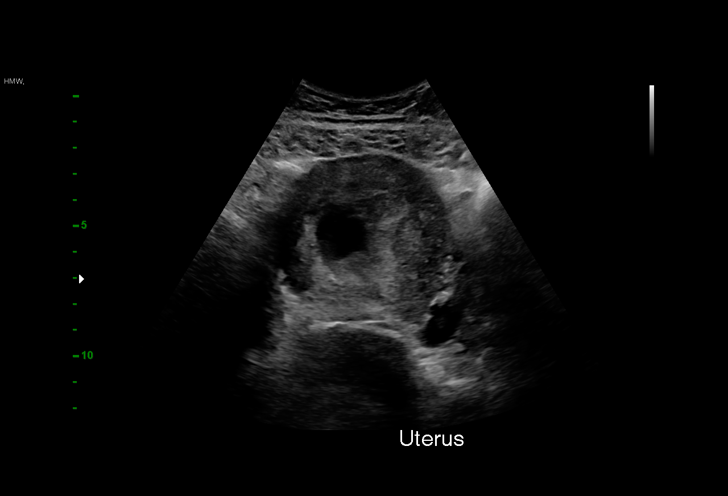
[im 17/31]
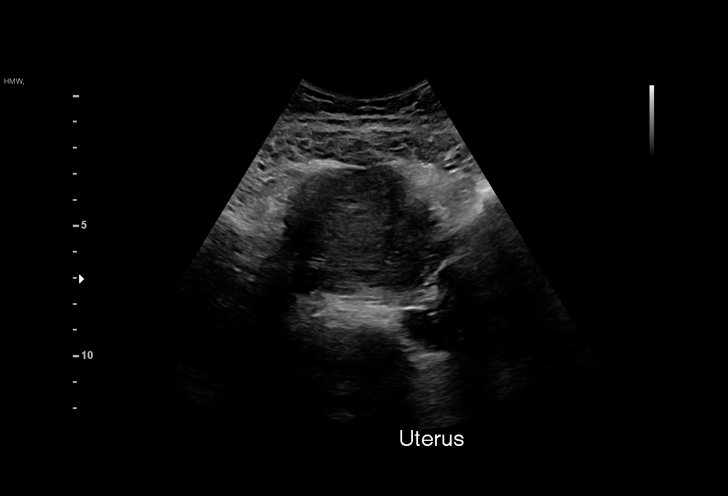
[im 19/31]
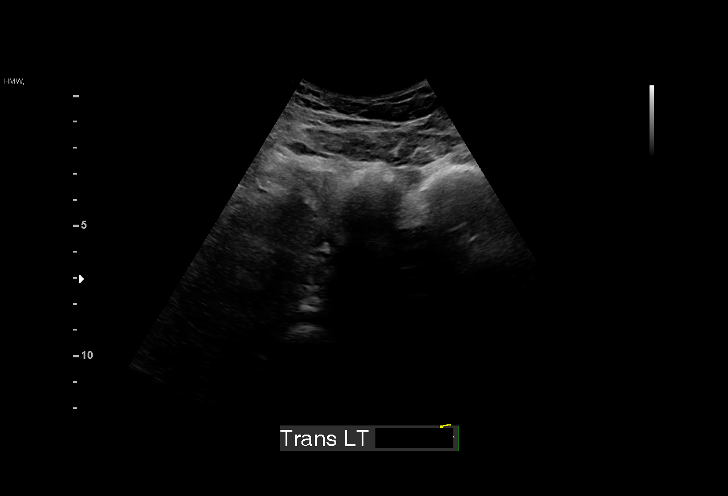
[im 22/31]
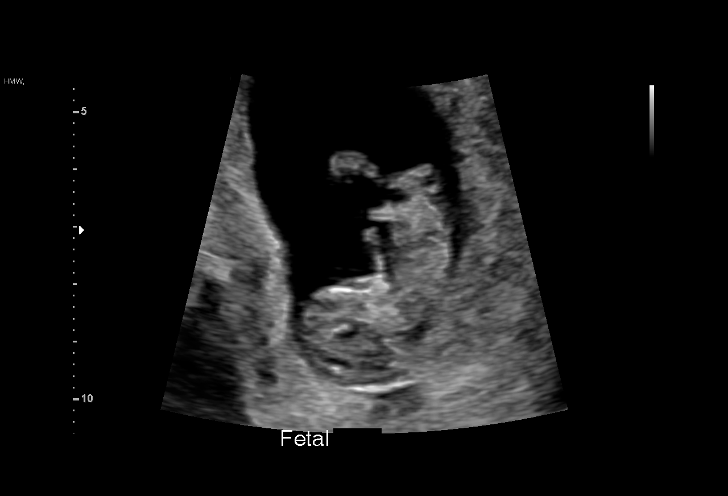
[im 24/31]
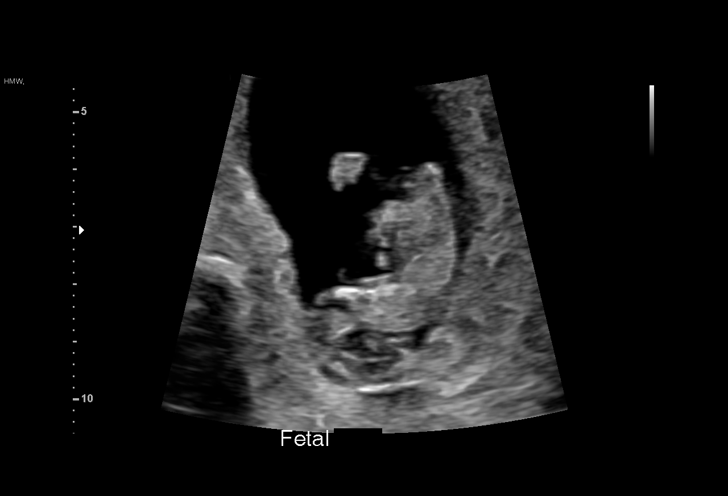
[im 26/31]
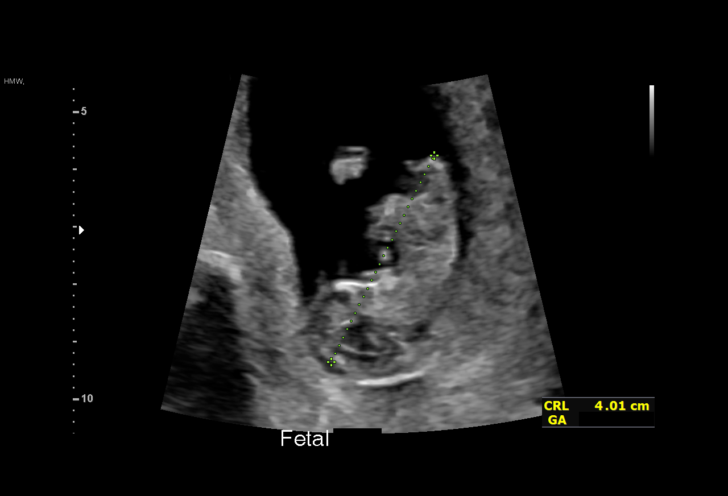
[im 28/31]
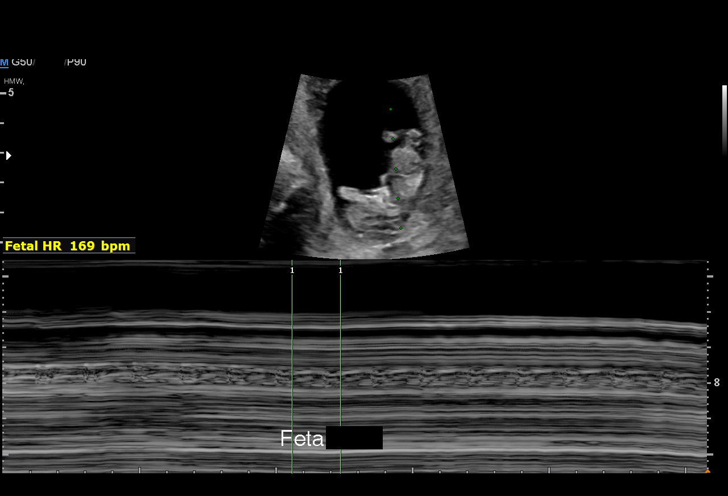
[im 31/31]
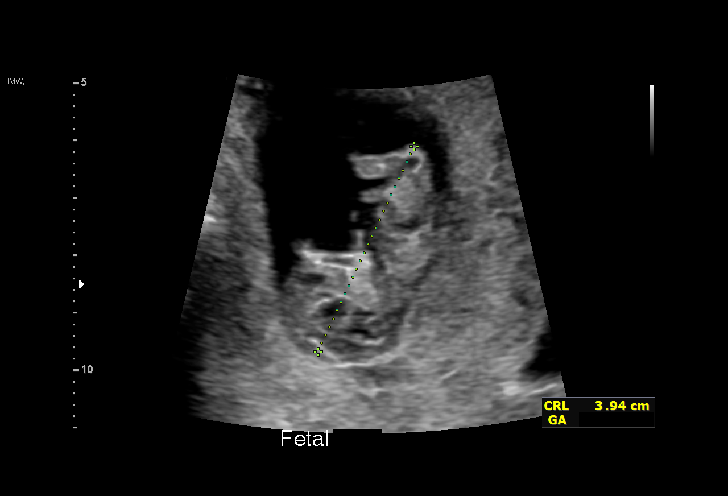

[15 of 28 positions shown; findings below may reference images not displayed]

FINDINGS: Intrauterine gestational sac: Single

Yolk sac:  Negative.

Embryo:  Present.

Cardiac Activity: Present.

Heart Rate: 169 bpm

CRL:   39.8 mm   10 w 6 d                  US EDC: 05/15/2021

Subchorionic hemorrhage:  None visualized.

Maternal uterus/adnexae: Neither ovary visualized. No adnexal mass
or free fluid.
IMPRESSION: 1. Single viable intrauterine pregnancy, estimated gestational age
10 weeks and 6 days by crown-rump length, with ultrasound EDC of
05/15/2021. No complication.
2. No other acute maternal uterine or adnexal abnormality.

## 2022-06-08 NOTE — Telephone Encounter (Signed)
It appears that the genetic counselor has addressed this.

## 2022-06-10 ENCOUNTER — Ambulatory Visit (INDEPENDENT_AMBULATORY_CARE_PROVIDER_SITE_OTHER): Payer: BC Managed Care – PPO | Admitting: Family Medicine

## 2022-06-10 VITALS — BP 99/79 | HR 80 | Wt 168.0 lb

## 2022-06-10 DIAGNOSIS — D573 Sickle-cell trait: Secondary | ICD-10-CM

## 2022-06-10 DIAGNOSIS — Z3A14 14 weeks gestation of pregnancy: Secondary | ICD-10-CM

## 2022-06-10 DIAGNOSIS — O0990 Supervision of high risk pregnancy, unspecified, unspecified trimester: Secondary | ICD-10-CM

## 2022-06-10 NOTE — Progress Notes (Signed)
   PRENATAL VISIT NOTE  Subjective:  Leah Vargas is a 32 y.o. G2P1001 at [redacted]w[redacted]d being seen today for ongoing prenatal care.  She is currently monitored for the following issues for this low-risk pregnancy and has Sickle-cell trait (HCC) and Supervision of high risk pregnancy, antepartum on their problem list.  Patient reports no complaints.  Contractions: Not present. Vag. Bleeding: None.  Movement: Absent. Denies leaking of fluid.   The following portions of the patient's history were reviewed and updated as appropriate: allergies, current medications, past family history, past medical history, past social history, past surgical history and problem list.   Objective:   Vitals:   06/10/22 1457  BP: 99/79  Pulse: 80  Weight: 168 lb (76.2 kg)    Fetal Status: Fetal Heart Rate (bpm): 154   Movement: Absent     General:  Alert, oriented and cooperative. Patient is in no acute distress.  Skin: Skin is warm and dry. No rash noted.   Cardiovascular: Normal heart rate noted  Respiratory: Normal respiratory effort, no problems with respiration noted  Abdomen: Soft, gravid, appropriate for gestational age.  Pain/Pressure: Present     Pelvic: Cervical exam deferred        Extremities: Normal range of motion.  Edema: None  Mental Status: Normal mood and affect. Normal behavior. Normal judgment and thought content.   Assessment and Plan:  Pregnancy: G2P1001 at [redacted]w[redacted]d 1. Supervision of high risk pregnancy, antepartum Genetics today - Panorama Prenatal Test Full Panel - HORIZON CUSTOM - Culture, OB Urine  2. Sickle-cell trait (HCC) Wants amnio for fetal testing.   General obstetric precautions including but not limited to vaginal bleeding, contractions, leaking of fluid and fetal movement were reviewed in detail with the patient. Please refer to After Visit Summary for other counseling recommendations.   Return in 4 weeks (on 07/08/2022).  Future Appointments  Date Time  Provider Department Center  06/18/2022 10:30 AM [facility] NURSE [facility]  06/18/2022 10:45 AM [facility] US5 [facility]  07/02/2022 10:30 AM [facility] NURSE [facility]  07/02/2022 10:45 AM [facility] US4 [facility]  07/07/2022  8:35 AM Stinson, Rhona Raider, DO [facility] None    Reva Bores, MD

## 2022-06-10 NOTE — Patient Instructions (Signed)
For cold and allergy symptoms  You may take Mucinex, (Allegra, Claritin, Zyrtec, Xyzal, Clarinex-any one of these--they are the same class--same as Benadryl), Sudafed (must be behind the counter, should not be phenylephrine), saline or steroid (Nasacort, Nasonex and Flonase) nasal sprays.  

## 2022-06-12 LAB — CULTURE, OB URINE

## 2022-06-12 LAB — URINE CULTURE, OB REFLEX

## 2022-06-18 ENCOUNTER — Other Ambulatory Visit: Payer: Self-pay | Admitting: Obstetrics and Gynecology

## 2022-06-18 ENCOUNTER — Encounter: Payer: Self-pay | Admitting: *Deleted

## 2022-06-18 ENCOUNTER — Ambulatory Visit: Payer: BC Managed Care – PPO | Attending: Obstetrics and Gynecology

## 2022-06-18 ENCOUNTER — Ambulatory Visit: Payer: BC Managed Care – PPO

## 2022-06-18 ENCOUNTER — Ambulatory Visit: Payer: BC Managed Care – PPO | Admitting: *Deleted

## 2022-06-18 VITALS — BP 91/74 | HR 81

## 2022-06-18 DIAGNOSIS — O285 Abnormal chromosomal and genetic finding on antenatal screening of mother: Secondary | ICD-10-CM | POA: Diagnosis not present

## 2022-06-18 DIAGNOSIS — Z363 Encounter for antenatal screening for malformations: Secondary | ICD-10-CM | POA: Diagnosis present

## 2022-06-18 DIAGNOSIS — D573 Sickle-cell trait: Secondary | ICD-10-CM | POA: Insufficient documentation

## 2022-06-18 DIAGNOSIS — Z148 Genetic carrier of other disease: Secondary | ICD-10-CM

## 2022-06-18 DIAGNOSIS — O99012 Anemia complicating pregnancy, second trimester: Secondary | ICD-10-CM | POA: Insufficient documentation

## 2022-06-18 DIAGNOSIS — Z3A16 16 weeks gestation of pregnancy: Secondary | ICD-10-CM | POA: Insufficient documentation

## 2022-06-18 DIAGNOSIS — O0990 Supervision of high risk pregnancy, unspecified, unspecified trimester: Secondary | ICD-10-CM

## 2022-06-18 NOTE — Addendum Note (Signed)
Addended by: Robbie Lis A on: 06/18/2022 12:04 PM   Modules accepted: Orders

## 2022-06-20 LAB — PANORAMA PRENATAL TEST FULL PANEL:PANORAMA TEST PLUS 5 ADDITIONAL MICRODELETIONS: FETAL FRACTION: 6.2

## 2022-06-20 LAB — MATERNAL CELL CONTAMINATION

## 2022-06-21 LAB — HORIZON CUSTOM: REPORT SUMMARY: POSITIVE — AB

## 2022-06-29 LAB — CHROMOSOME MICROARRAY REFLEX, AMN FLD

## 2022-07-01 ENCOUNTER — Telehealth: Payer: Self-pay | Admitting: Obstetrics and Gynecology

## 2022-07-01 NOTE — Telephone Encounter (Signed)
I left a voicemail for this patient to let her know that part of the results from the amniocentesis are available and that they are good news, but that the sickle cell testing is still pending.  I provided our contact number if she would like to call and let her know that I will reach out to her as soon as the sickle cell test results become available. Cherly Anderson, MS, CGC

## 2022-07-02 ENCOUNTER — Ambulatory Visit: Payer: BC Managed Care – PPO

## 2022-07-07 ENCOUNTER — Ambulatory Visit (INDEPENDENT_AMBULATORY_CARE_PROVIDER_SITE_OTHER): Payer: BC Managed Care – PPO | Admitting: Family Medicine

## 2022-07-07 ENCOUNTER — Telehealth: Payer: Self-pay | Admitting: Obstetrics and Gynecology

## 2022-07-07 VITALS — BP 99/61 | HR 98 | Wt 168.0 lb

## 2022-07-07 DIAGNOSIS — O0990 Supervision of high risk pregnancy, unspecified, unspecified trimester: Secondary | ICD-10-CM

## 2022-07-07 DIAGNOSIS — D573 Sickle-cell trait: Secondary | ICD-10-CM

## 2022-07-07 DIAGNOSIS — J069 Acute upper respiratory infection, unspecified: Secondary | ICD-10-CM

## 2022-07-07 LAB — CHROMOSOME MICROARRAY REFLEX, AMN FLD

## 2022-07-07 NOTE — Progress Notes (Signed)
   PRENATAL VISIT NOTE  Subjective:  Leah Vargas is a 32 y.o. G2P1001 at [redacted]w[redacted]d being seen today for ongoing prenatal care.  She is currently monitored for the following issues for this high-risk pregnancy and has Sickle-cell trait (HCC) and Supervision of high risk pregnancy, antepartum on their problem list.  Patient reports  has sinus congestion for several weeks. Now developing cough and sore throat .  Contractions: Irritability. Vag. Bleeding: None.   . Denies leaking of fluid.   The following portions of the patient's history were reviewed and updated as appropriate: allergies, current medications, past family history, past medical history, past social history, past surgical history and problem list.   Objective:   Vitals:   07/07/22 0852  BP: 99/61  Pulse: 98  Weight: 168 lb (76.2 kg)    Fetal Status: Fetal Heart Rate (bpm): 158         General:  Alert, oriented and cooperative. Patient is in no acute distress.  Skin: Skin is warm and dry. No rash noted.   Cardiovascular: Normal heart rate noted  Respiratory: Normal respiratory effort, no problems with respiration noted  Abdomen: Soft, gravid, appropriate for gestational age.  Pain/Pressure: Absent     Pelvic: Cervical exam deferred        Extremities: Normal range of motion.  Edema: None  Mental Status: Normal mood and affect. Normal behavior. Normal judgment and thought content.   Assessment and Plan:  Pregnancy: G2P1001 at [redacted]w[redacted]d 1. Supervision of high risk pregnancy, antepartum FHT and FH normal  2. Sickle-cell trait (HCC) Amniocentesis scheduled  3. Viral upper respiratory tract infection No sinus tenderness to percussion. Recommended Mucinex DM  Preterm labor symptoms and general obstetric precautions including but not limited to vaginal bleeding, contractions, leaking of fluid and fetal movement were reviewed in detail with the patient. Please refer to After Visit Summary for other counseling  recommendations.   No follow-ups on file.  Future Appointments  Date Time Provider Department Center  07/16/2022  7:15 AM [facility] NURSE [facility]  07/16/2022  7:30 AM [facility] US2 [facility]  08/04/2022 11:15 AM Levie Heritage, DO [facility] None    Levie Heritage, DO

## 2022-07-07 NOTE — Telephone Encounter (Signed)
I spoke with Ms. Bedrosian to review the results of the sickle cell fetal analysis from her recent amniocentesis.  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 The patient is well informed about this condition and stated that she had no questions at the time of our conversation but would call back if needed.  We also informed her that we can review options for the pregnancy if the couple desires to talk about that further, as she had previously indicated that she may want to know those options depending upon these results.  We can be reached at [phone].  Cherly Anderson, MS, CGC

## 2022-07-09 ENCOUNTER — Ambulatory Visit: Payer: BC Managed Care – PPO

## 2022-07-16 ENCOUNTER — Ambulatory Visit: Payer: BC Managed Care – PPO | Admitting: *Deleted

## 2022-07-16 ENCOUNTER — Encounter: Payer: Self-pay | Admitting: Family Medicine

## 2022-07-16 ENCOUNTER — Ambulatory Visit: Payer: BC Managed Care – PPO | Attending: Obstetrics and Gynecology

## 2022-07-16 ENCOUNTER — Other Ambulatory Visit: Payer: Self-pay | Admitting: *Deleted

## 2022-07-16 VITALS — BP 98/50 | HR 81

## 2022-07-16 DIAGNOSIS — O3510X0 Maternal care for (suspected) chromosomal abnormality in fetus, unspecified, not applicable or unspecified: Secondary | ICD-10-CM

## 2022-07-16 DIAGNOSIS — D573 Sickle-cell trait: Secondary | ICD-10-CM | POA: Diagnosis present

## 2022-07-16 DIAGNOSIS — Z3A20 20 weeks gestation of pregnancy: Secondary | ICD-10-CM

## 2022-07-16 DIAGNOSIS — O285 Abnormal chromosomal and genetic finding on antenatal screening of mother: Secondary | ICD-10-CM | POA: Insufficient documentation

## 2022-07-16 DIAGNOSIS — O0990 Supervision of high risk pregnancy, unspecified, unspecified trimester: Secondary | ICD-10-CM

## 2022-07-16 DIAGNOSIS — O358XX0 Maternal care for other (suspected) fetal abnormality and damage, not applicable or unspecified: Secondary | ICD-10-CM | POA: Diagnosis not present

## 2022-07-16 DIAGNOSIS — Z362 Encounter for other antenatal screening follow-up: Secondary | ICD-10-CM

## 2022-07-18 LAB — MCC TRACKING

## 2022-07-18 LAB — CHROMOSOME MICROARRAY REFLEX, AMN FLD

## 2022-07-19 LAB — CHROMOSOME MICROARRAY REFLEX, AMN FLD

## 2022-07-20 LAB — CHROMOSOME ANALYSIS W REFLEX TO SNP, AMNIOTIC
Cells Analyzed: 15
Cells Counted: 15
Cells Karyotyped: 2
Colonies: 15
GTG Band Resolution Achieved: 450

## 2022-07-20 LAB — SICKLE CELL, FETAL ANALYSIS

## 2022-07-20 LAB — AFP, AMNIOTIC FLUID
AFP, Amniotic Fluid (mcg/ml): 18 ug/mL
Gestational Age(Wks): 16
MOM, Amniotic Fluid: 1.34

## 2022-07-20 LAB — SICKLE CELL ANALYSIS

## 2022-07-20 LAB — MATERNAL CELL CONTAMINATION

## 2022-07-20 LAB — CHROMOSOME MICROARRAY REFLEX, AMN FLD

## 2022-07-20 LAB — MCC, CONTROL

## 2022-07-21 ENCOUNTER — Telehealth: Payer: Self-pay | Admitting: Obstetrics and Gynecology

## 2022-07-21 NOTE — Telephone Encounter (Signed)
I called [name] to discuss her negative chromosomal microarray results from amniocentesis. Microarray analysis revealed a normal female complement with no significant DNA copy number changes or copy neutral regions and no maternal cell contamination. This significantly reduces the possibility of a chromosomal microdeletion or microduplication syndrome in this fetus. While karyotype and chromosomal microarray cannot rule out all genetic syndromes, normal karyotype and chromosomal microarray analyses for the current fetus rule out clinically significant chromosomal abnormalities. [name] confirmed that she had no further questions at this time.  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  She already has an affected child and therefore is aware of the condition and had no questions.  We can be reached at [phone] with questions or concerns.   Leah Stack, MS, [facility] Genetic Counselor

## 2022-08-04 ENCOUNTER — Ambulatory Visit (INDEPENDENT_AMBULATORY_CARE_PROVIDER_SITE_OTHER): Payer: BC Managed Care – PPO | Admitting: Family Medicine

## 2022-08-04 VITALS — BP 91/55 | HR 83 | Wt 170.0 lb

## 2022-08-04 DIAGNOSIS — O285 Abnormal chromosomal and genetic finding on antenatal screening of mother: Secondary | ICD-10-CM

## 2022-08-04 DIAGNOSIS — R1110 Vomiting, unspecified: Secondary | ICD-10-CM

## 2022-08-04 DIAGNOSIS — D573 Sickle-cell trait: Secondary | ICD-10-CM

## 2022-08-04 DIAGNOSIS — O0990 Supervision of high risk pregnancy, unspecified, unspecified trimester: Secondary | ICD-10-CM

## 2022-08-04 MED ORDER — ONDANSETRON 4 MG PO TBDP: 4.0000 mg | ORAL_TABLET | Freq: Four times a day (QID) | ORAL | 4 refills | Status: DC | PRN

## 2022-08-04 NOTE — Progress Notes (Signed)
   PRENATAL VISIT NOTE  Subjective:  Leah Vargas is a 32 y.o. G2P1001 at [redacted]w[redacted]d being seen today for ongoing prenatal care.  She is currently monitored for the following issues for this high-risk pregnancy and has Hyperemesis; Sickle-cell trait (HCC); Supervision of high risk pregnancy, antepartum; and Abnormal genetic test during pregnancy on their problem list.  Patient reports  continues to have quite a bit of nausea and vomiting.  She is able to eat bland foods like bread and rice.  Takes Zofran twice a day with some improvement.   Contractions: Not present. Vag. Bleeding: None.  Movement: Present. Denies leaking of fluid.   The following portions of the patient's history were reviewed and updated as appropriate: allergies, current medications, past family history, past medical history, past social history, past surgical history and problem list.   Objective:   Vitals:   08/04/22 1124  BP: (!) 91/55  Pulse: 83  Weight: 170 lb (77.1 kg)    Fetal Status: Fetal Heart Rate (bpm): 155   Movement: Present     General:  Alert, oriented and cooperative. Patient is in no acute distress.  Skin: Skin is warm and dry. No rash noted.   Cardiovascular: Normal heart rate noted  Respiratory: Normal respiratory effort, no problems with respiration noted  Abdomen: Soft, gravid, appropriate for gestational age.  Pain/Pressure: Present     Pelvic: Cervical exam deferred        Extremities: Normal range of motion.  Edema: None  Mental Status: Normal mood and affect. Normal behavior. Normal judgment and thought content.   Assessment and Plan:  Pregnancy: G2P1001 at [redacted]w[redacted]d 1. Supervision of high risk pregnancy, antepartum FHT normal  2. Sickle-cell trait (HCC)   3. Abnormal genetic test during pregnancy Baby has [place] disease. Serial growth ultrasounds  4. Hyperemesis Continue Zofran twice a day.  Patient not interested in taking more.  Does have some heartburn, which could  exacerbate the patient's hyperemesis.  Recommended Tums or milk which could be helpful.  Patient hesitant about taking anything additional.  Preterm labor symptoms and general obstetric precautions including but not limited to vaginal bleeding, contractions, leaking of fluid and fetal movement were reviewed in detail with the patient. Please refer to After Visit Summary for other counseling recommendations.   No follow-ups on file.  Future Appointments  Date Time Provider Department Center  08/11/2022  2:15 PM [facility] NURSE [facility]  08/11/2022  2:30 PM [facility]3 [facility]  09/09/2022  8:35 AM Milas Hock, MD [facility] None    Levie Heritage, DO

## 2022-08-11 ENCOUNTER — Other Ambulatory Visit: Payer: Self-pay | Admitting: *Deleted

## 2022-08-11 ENCOUNTER — Ambulatory Visit: Payer: BC Managed Care – PPO | Attending: Obstetrics

## 2022-08-11 ENCOUNTER — Ambulatory Visit: Payer: BC Managed Care – PPO

## 2022-08-11 VITALS — BP 104/49 | HR 76

## 2022-08-11 DIAGNOSIS — O3510X0 Maternal care for (suspected) chromosomal abnormality in fetus, unspecified, not applicable or unspecified: Secondary | ICD-10-CM | POA: Diagnosis not present

## 2022-08-11 DIAGNOSIS — O0990 Supervision of high risk pregnancy, unspecified, unspecified trimester: Secondary | ICD-10-CM | POA: Diagnosis present

## 2022-08-11 DIAGNOSIS — O99019 Anemia complicating pregnancy, unspecified trimester: Secondary | ICD-10-CM | POA: Insufficient documentation

## 2022-08-11 DIAGNOSIS — D573 Sickle-cell trait: Secondary | ICD-10-CM | POA: Insufficient documentation

## 2022-08-11 DIAGNOSIS — Z362 Encounter for other antenatal screening follow-up: Secondary | ICD-10-CM | POA: Insufficient documentation

## 2022-08-11 DIAGNOSIS — Z3A23 23 weeks gestation of pregnancy: Secondary | ICD-10-CM | POA: Diagnosis not present

## 2022-08-20 LAB — INFORMED CONSENT NEEDED

## 2022-09-09 ENCOUNTER — Other Ambulatory Visit: Payer: Self-pay | Admitting: Family Medicine

## 2022-09-09 ENCOUNTER — Encounter: Payer: Self-pay | Admitting: Obstetrics and Gynecology

## 2022-09-09 ENCOUNTER — Ambulatory Visit (INDEPENDENT_AMBULATORY_CARE_PROVIDER_SITE_OTHER): Payer: Medicaid Other | Admitting: Obstetrics and Gynecology

## 2022-09-09 VITALS — BP 90/57 | HR 77 | Wt 174.0 lb

## 2022-09-09 DIAGNOSIS — O99012 Anemia complicating pregnancy, second trimester: Secondary | ICD-10-CM

## 2022-09-09 DIAGNOSIS — O0990 Supervision of high risk pregnancy, unspecified, unspecified trimester: Secondary | ICD-10-CM

## 2022-09-09 DIAGNOSIS — O285 Abnormal chromosomal and genetic finding on antenatal screening of mother: Secondary | ICD-10-CM | POA: Diagnosis not present

## 2022-09-09 DIAGNOSIS — Z3A27 27 weeks gestation of pregnancy: Secondary | ICD-10-CM

## 2022-09-09 DIAGNOSIS — Z1339 Encounter for screening examination for other mental health and behavioral disorders: Secondary | ICD-10-CM | POA: Diagnosis not present

## 2022-09-09 DIAGNOSIS — R1110 Vomiting, unspecified: Secondary | ICD-10-CM

## 2022-09-09 DIAGNOSIS — O99013 Anemia complicating pregnancy, third trimester: Secondary | ICD-10-CM

## 2022-09-09 DIAGNOSIS — D573 Sickle-cell trait: Secondary | ICD-10-CM

## 2022-09-09 NOTE — Progress Notes (Addendum)
   PRENATAL VISIT NOTE  Subjective:  Leah Vargas is a 32 y.o. G2P1001 at [redacted]w[redacted]d being seen today for ongoing prenatal care.  She is currently monitored for the following issues for this low-risk pregnancy and has Hyperemesis; Sickle-cell trait (HCC); Supervision of high risk pregnancy, antepartum; and Abnormal genetic test during pregnancy on their problem list.  Patient reports no complaints.  Contractions: Irritability. Vag. Bleeding: None.  Movement: Present. Denies leaking of fluid.   The following portions of the patient's history were reviewed and updated as appropriate: allergies, current medications, past family history, past medical history, past social history, past surgical history and problem list.   Objective:   Vitals:   09/09/22 0841  BP: (!) 90/57  Pulse: 77  Weight: 174 lb (78.9 kg)    Fetal Status: Fetal Heart Rate (bpm): 145 Fundal Height: 26 cm Movement: Present     General:  Alert, oriented and cooperative. Patient is in no acute distress.  Skin: Skin is warm and dry. No rash noted.   Cardiovascular: Normal heart rate noted  Respiratory: Normal respiratory effort, no problems with respiration noted  Abdomen: Soft, gravid, appropriate for gestational age.  Pain/Pressure: Present (pressure)     Pelvic: Cervical exam deferred        Extremities: Normal range of motion.     Mental Status: Normal mood and affect. Normal behavior. Normal judgment and thought content.   Assessment and Plan:  Pregnancy: G2P1001 at [redacted]w[redacted]d 1. Sickle-cell trait (HCC)  2. Supervision of high risk pregnancy, antepartum 28w labs today Discussed it today and she will do Tdap next visit.  Discussed birth control. She is considering IUD but unsure if LNG or copper. Discussed risks/benefits of each and information given.   3. Hyperemesis Takign Zofran a couple times a day.   4. Abnormal genetic test during pregnancy Amnio 5/17- 46,XY nl AFAFP, fetus with S/C disease, normal  microarray Next growth 8/14. Normal growth thus far.   5. Pregnancy with 27 completed weeks gestation   Preterm labor symptoms and general obstetric precautions including but not limited to vaginal bleeding, contractions, leaking of fluid and fetal movement were reviewed in detail with the patient. Please refer to After Visit Summary for other counseling recommendations.   Return in about 2 weeks (around 09/23/2022) for OB VISIT, MD or APP.  Future Appointments  Date Time Provider Department Center  09/15/2022  1:30 PM [facility]  10/13/2022  3:30 PM [facility]2 [facility]    Milas Hock, MD

## 2022-09-10 ENCOUNTER — Encounter: Payer: Self-pay | Admitting: Obstetrics and Gynecology

## 2022-09-10 DIAGNOSIS — O99013 Anemia complicating pregnancy, third trimester: Secondary | ICD-10-CM | POA: Insufficient documentation

## 2022-09-10 MED ORDER — FERROUS SULFATE 324 MG PO TBEC
324.0000 mg | DELAYED_RELEASE_TABLET | ORAL | 3 refills | Status: AC
Start: 2022-09-10 — End: ?

## 2022-09-10 NOTE — Addendum Note (Signed)
Addended by: Milas Hock A on: 09/10/2022 02:38 PM   Modules accepted: Orders

## 2022-09-15 ENCOUNTER — Ambulatory Visit: Payer: Medicaid Other | Attending: Obstetrics and Gynecology

## 2022-09-15 DIAGNOSIS — O3510X0 Maternal care for (suspected) chromosomal abnormality in fetus, unspecified, not applicable or unspecified: Secondary | ICD-10-CM

## 2022-09-15 DIAGNOSIS — Z362 Encounter for other antenatal screening follow-up: Secondary | ICD-10-CM | POA: Insufficient documentation

## 2022-09-15 DIAGNOSIS — Z3A28 28 weeks gestation of pregnancy: Secondary | ICD-10-CM

## 2022-09-15 DIAGNOSIS — O358XX0 Maternal care for other (suspected) fetal abnormality and damage, not applicable or unspecified: Secondary | ICD-10-CM

## 2022-09-17 ENCOUNTER — Other Ambulatory Visit: Payer: Medicaid Other

## 2022-09-17 DIAGNOSIS — O0990 Supervision of high risk pregnancy, unspecified, unspecified trimester: Secondary | ICD-10-CM

## 2022-09-17 NOTE — Progress Notes (Signed)
Patient sent to lab to attempt her 1 gtt. Armandina Stammer RN

## 2022-09-18 LAB — GLUCOSE TOLERANCE, 1 HOUR: Glucose, 1Hr PP: 127 mg/dL (ref 70–199)

## 2022-09-21 ENCOUNTER — Ambulatory Visit (INDEPENDENT_AMBULATORY_CARE_PROVIDER_SITE_OTHER): Payer: Managed Care, Other (non HMO) | Admitting: Obstetrics and Gynecology

## 2022-09-21 VITALS — BP 102/63 | HR 84 | Wt 176.0 lb

## 2022-09-21 DIAGNOSIS — Z23 Encounter for immunization: Secondary | ICD-10-CM | POA: Diagnosis not present

## 2022-09-21 DIAGNOSIS — R1110 Vomiting, unspecified: Secondary | ICD-10-CM

## 2022-09-21 DIAGNOSIS — O285 Abnormal chromosomal and genetic finding on antenatal screening of mother: Secondary | ICD-10-CM

## 2022-09-21 DIAGNOSIS — O0990 Supervision of high risk pregnancy, unspecified, unspecified trimester: Secondary | ICD-10-CM

## 2022-09-21 DIAGNOSIS — Z3A29 29 weeks gestation of pregnancy: Secondary | ICD-10-CM

## 2022-09-21 NOTE — Progress Notes (Signed)
   PRENATAL VISIT NOTE  Subjective:  Leah Vargas is a 32 y.o. G2P1001 at [redacted]w[redacted]d being seen today for ongoing prenatal care.  She is currently monitored for the following issues for this low-risk pregnancy and has Hyperemesis; Sickle-cell trait (HCC); Supervision of high risk pregnancy, antepartum; Abnormal genetic test during pregnancy; and Anemia of pregnancy in third trimester on their problem list.  Patient reports  back pain and leg pain just started last couple of days . Still having nausea, not taking meds q6hr. Takes in Am and if needed during day.   Contractions: Not present. Vag. Bleeding: None.  Movement: Present. Denies leaking of fluid.   The following portions of the patient's history were reviewed and updated as appropriate: allergies, current medications, past family history, past medical history, past social history, past surgical history and problem list.   Objective:   Vitals:   09/21/22 0958  BP: 102/63  Pulse: 84  Weight: 176 lb (79.8 kg)    Fetal Status: Fetal Heart Rate (bpm): 138 Fundal Height: 28 cm Movement: Present     General:  Alert, oriented and cooperative. Patient is in no acute distress.  Skin: Skin is warm and dry. No rash noted.   Cardiovascular: Normal heart rate noted  Respiratory: Normal respiratory effort, no problems with respiration noted  Abdomen: Soft, gravid, appropriate for gestational age.  Pain/Pressure: Present     Pelvic: Cervical exam deferred        Extremities: Normal range of motion.  Edema: None  Mental Status: Normal mood and affect. Normal behavior. Normal judgment and thought content.   Assessment and Plan:  Pregnancy: G2P1001 at [redacted]w[redacted]d 1. Supervision of high risk pregnancy, antepartum BP and FHR normal Feeling regular fetal movement  FH appropriate  2. Abnormal genetic test during pregnancy nml AFAFP S/C disease, normal microarray  8/14 u/s normal Follow up growth 9/11  3. [redacted] weeks gestation of  pregnancy Symptomatic management of back/leg pain. Discussed maternity belt as well. Notify if pain worsens - Tdap vaccine greater than or equal to 7yo IM  4. Hyperemesis Encouraged small meals, something every couple of hours. Continue PO zofran   Preterm labor symptoms and general obstetric precautions including but not limited to vaginal bleeding, contractions, leaking of fluid and fetal movement were reviewed in detail with the patient. Please refer to After Visit Summary for other counseling recommendations.   Return in two weeks for routine prenatal   Future Appointments  Date Time Provider Department Center  10/05/2022 11:15 AM Marny Lowenstein, PA-C [facility] None  10/13/2022  3:30 PM [facility] US2 [facility]  10/22/2022 10:55 AM Lorriane Shire, MD [facility] None  11/04/2022  1:10 PM Levie Heritage, DO [facility] None    Albertine Grates, FNP

## 2022-09-22 ENCOUNTER — Encounter (HOSPITAL_COMMUNITY): Payer: Self-pay | Admitting: Maternal & Fetal Medicine

## 2022-09-22 ENCOUNTER — Inpatient Hospital Stay (HOSPITAL_COMMUNITY)
Admission: AD | Admit: 2022-09-22 | Discharge: 2022-09-22 | Disposition: A | Discharge status: Home / Self Care | Payer: Medicaid Other | Attending: Maternal & Fetal Medicine | Admitting: Maternal & Fetal Medicine

## 2022-09-22 DIAGNOSIS — O219 Vomiting of pregnancy, unspecified: Secondary | ICD-10-CM | POA: Diagnosis not present

## 2022-09-22 DIAGNOSIS — B349 Viral infection, unspecified: Secondary | ICD-10-CM

## 2022-09-22 DIAGNOSIS — Z3A29 29 weeks gestation of pregnancy: Secondary | ICD-10-CM | POA: Diagnosis not present

## 2022-09-22 DIAGNOSIS — O26893 Other specified pregnancy related conditions, third trimester: Secondary | ICD-10-CM | POA: Diagnosis present

## 2022-09-22 DIAGNOSIS — O212 Late vomiting of pregnancy: Secondary | ICD-10-CM | POA: Insufficient documentation

## 2022-09-22 DIAGNOSIS — Z1152 Encounter for screening for COVID-19: Secondary | ICD-10-CM | POA: Diagnosis not present

## 2022-09-22 LAB — COMPREHENSIVE METABOLIC PANEL
ALT: 20 U/L (ref 0–44)
AST: 30 U/L (ref 15–41)
Albumin: 2.8 g/dL — ABNORMAL LOW (ref 3.5–5.0)
Alkaline Phosphatase: 59 U/L (ref 38–126)
Anion gap: 16 — ABNORMAL HIGH (ref 5–15)
BUN: 5 mg/dL — ABNORMAL LOW (ref 6–20)
CO2: 20 mmol/L — ABNORMAL LOW (ref 22–32)
Calcium: 8.9 mg/dL (ref 8.9–10.3)
Chloride: 102 mmol/L (ref 98–111)
Creatinine, Ser: 0.67 mg/dL (ref 0.44–1.00)
GFR, Estimated: >60 mL/min (ref >60)
Glucose, Bld: 95 mg/dL (ref 70–99)
Potassium: 3.9 mmol/L (ref 3.5–5.1)
Sodium: 138 mmol/L (ref 135–145)
Total Bilirubin: 1 mg/dL (ref 0.3–1.2)
Total Protein: 7.1 g/dL (ref 6.5–8.1)

## 2022-09-22 LAB — SARS CORONAVIRUS 2 BY RT PCR: SARS Coronavirus 2 by RT PCR: NEGATIVE

## 2022-09-22 IMAGING — US US MFM OB FOLLOW-UP
1 series · 14 of 28 positions shown · non-contrast
Comparison: none

[Series 1: us mfm ob follow-up · 73 acquisitions, 14 frames shown]
[im 3/73]
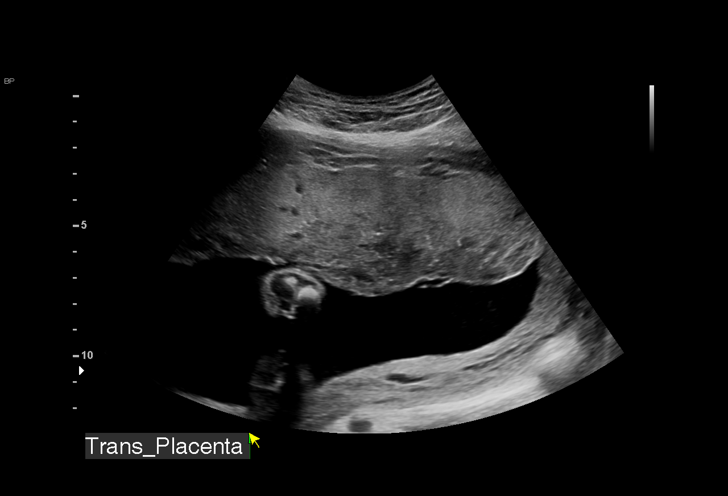
[im 9/73]
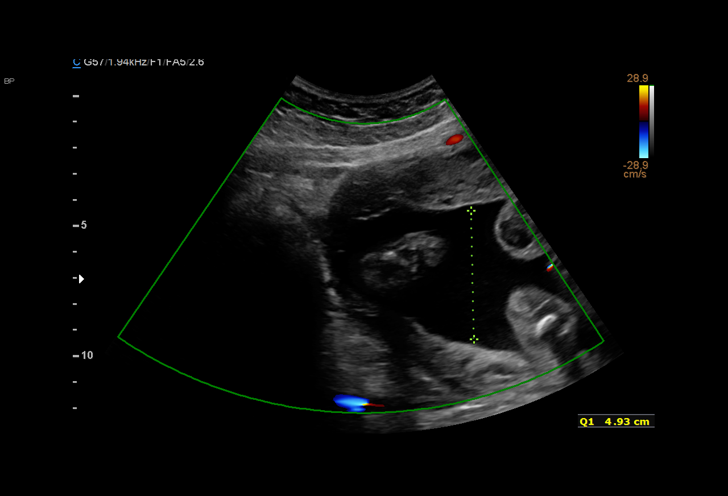
[im 14/73]
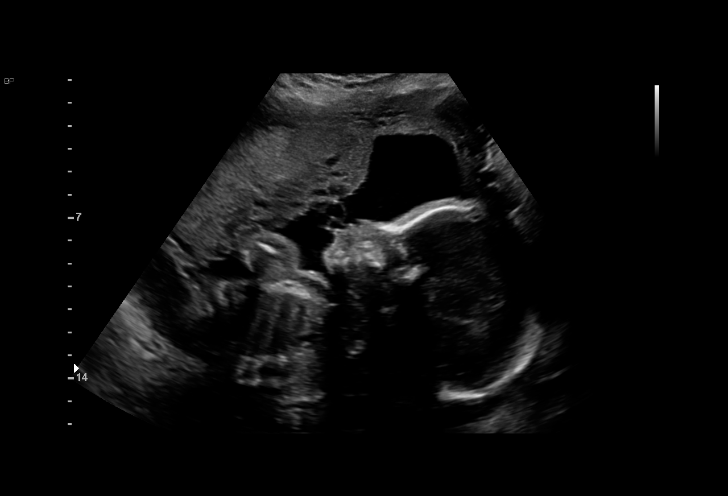
[im 19/73]
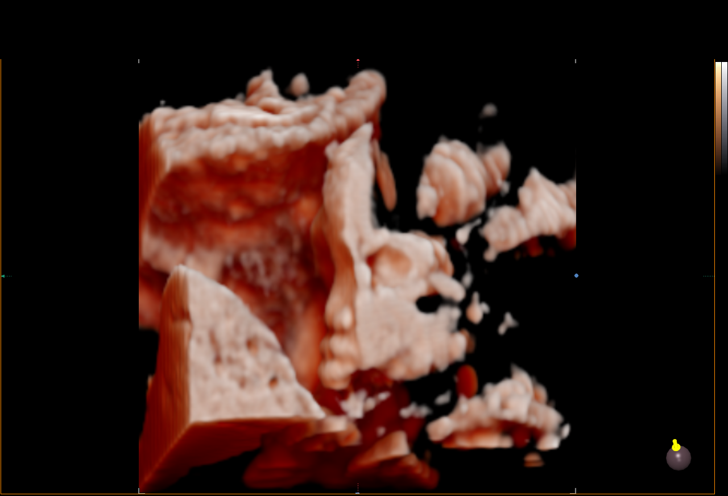
[im 25/73]
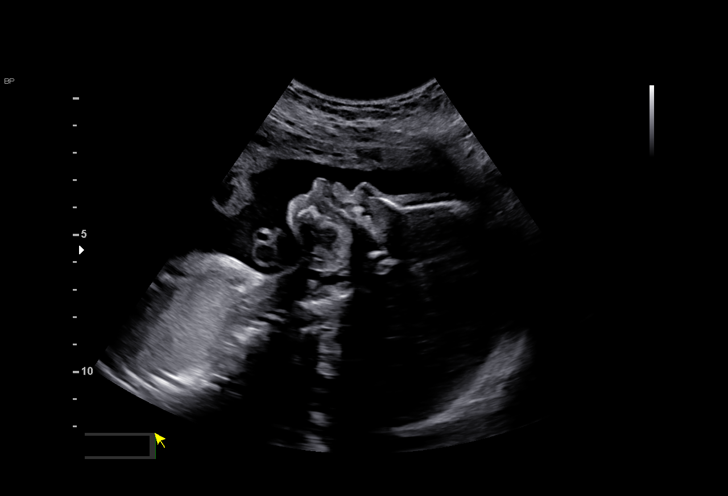
[im 30/73]
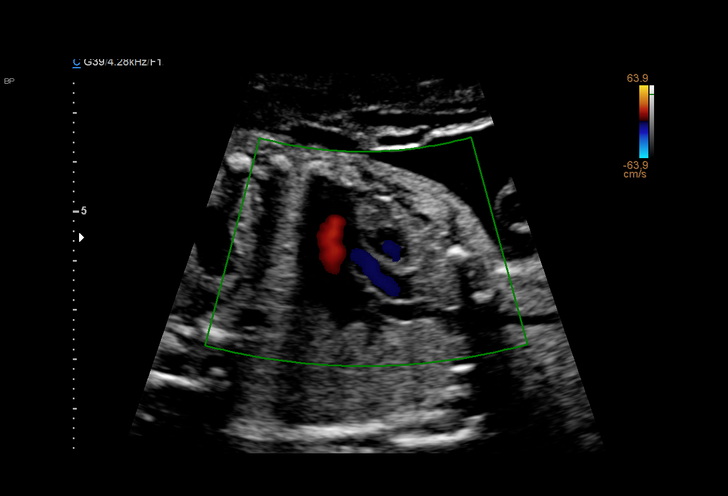
[im 35/73]
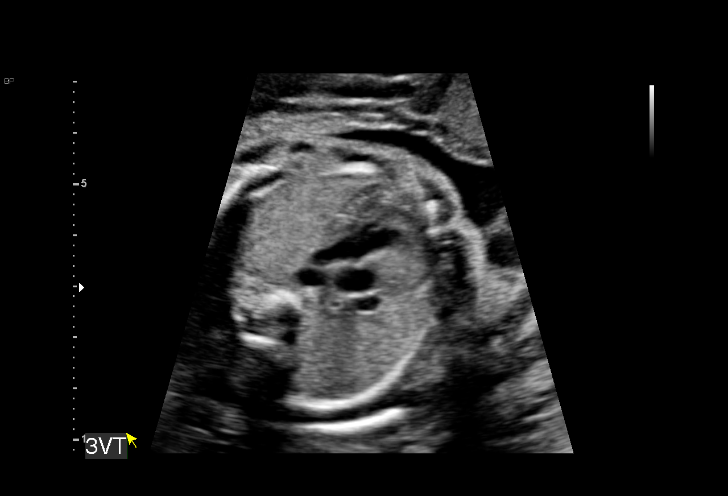
[im 41/73]
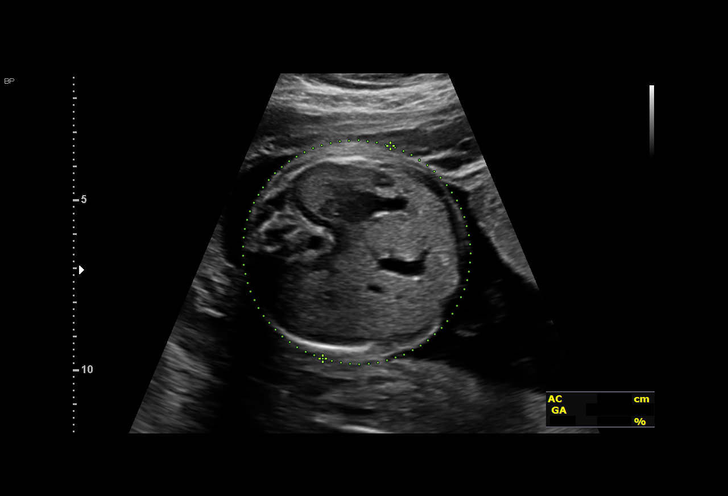
[im 46/73]
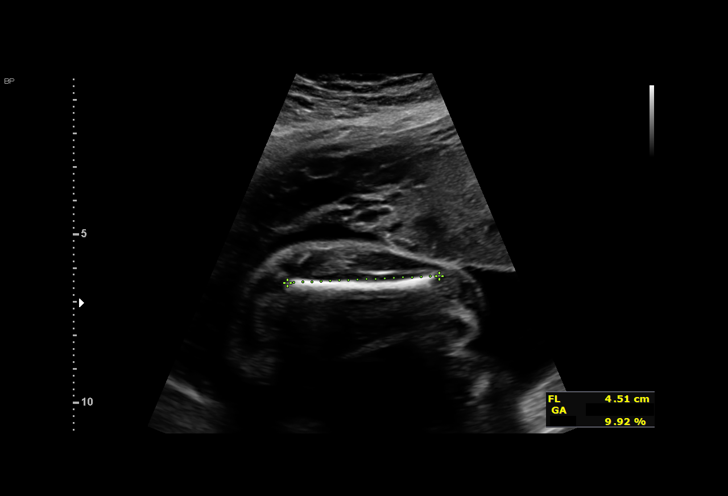
[im 51/73]
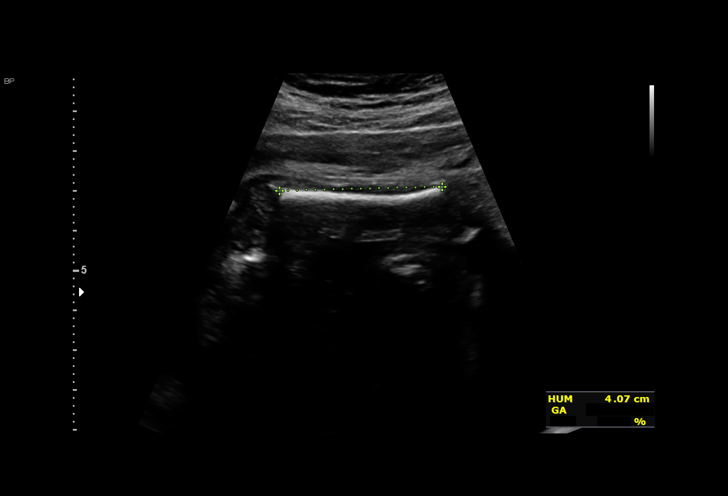
[im 57/73]
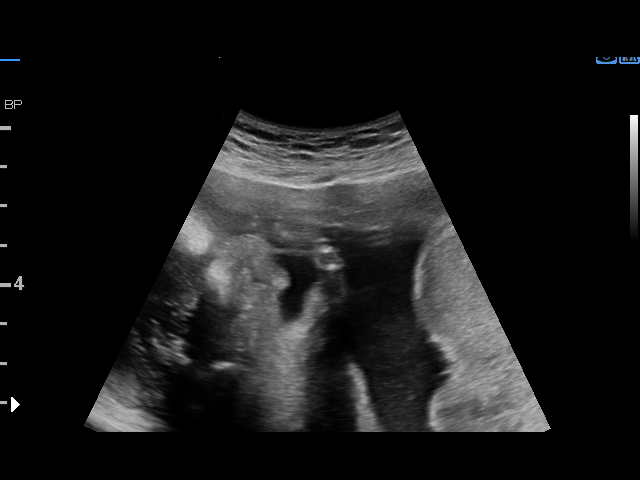
[im 62/73]
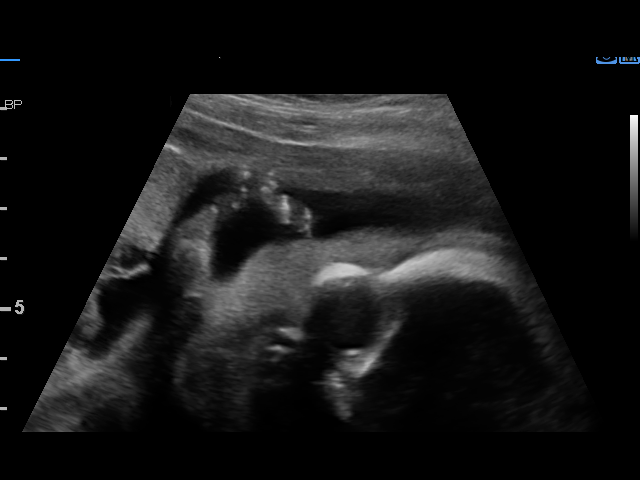
[im 67/73]
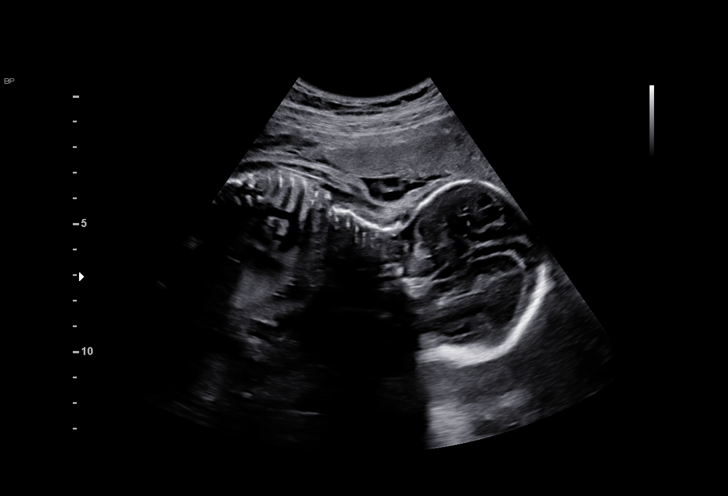
[im 73/73]
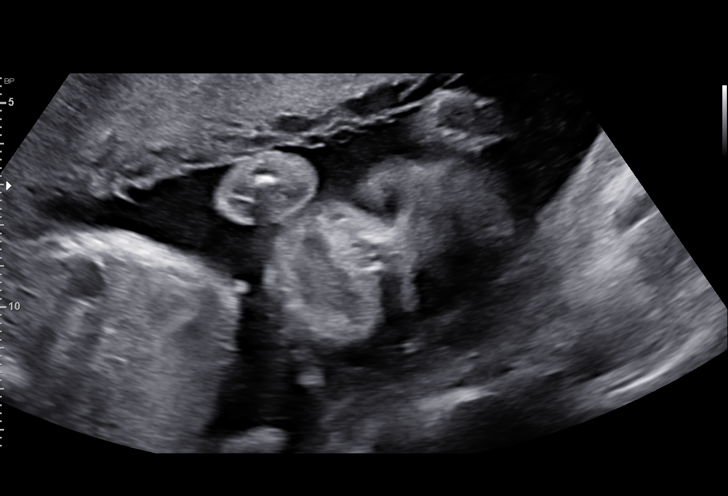

[14 of 28 positions shown; findings below may reference images not displayed]

[facility]

Indications

 Fetal Hb SC (confirmed on amnio)
 Genetic carrier (sickle cell trait)
 LR NIPS/Negative AFP
 26 weeks gestation of pregnancy
Fetal Evaluation

 Num Of Fetuses:         1
 Fetal Heart Rate(bpm):  158
 Cardiac Activity:       Observed
 Presentation:           Cephalic
 Placenta:               Anterior
 P. Cord Insertion:      Visualized, central

 Amniotic Fluid
 AFI FV:      Within normal limits

 AFI Sum(cm)     %Tile       Largest Pocket(cm)
 13.38           38

 RUQ(cm)       RLQ(cm)       LUQ(cm)        LLQ(cm)

Biometry

 BPD:      67.1  mm     G. Age:  27w 0d         75  %    CI:        75.27   %    70 - 86
                                                         FL/HC:      18.4   %    18.6 -
 HC:      245.3  mm     G. Age:  26w 4d         47  %    HC/AC:      1.19        1.04 -
 AC:      205.6  mm     G. Age:  25w 1d         17  %    FL/BPD:     67.4   %    71 - 87
 FL:       45.2  mm     G. Age:  25w 0d         11  %    FL/AC:      22.0   %    20 - 24
 HUM:      40.6  mm     G. Age:  24w 5d         12  %
 LV:        3.8  mm
 Est. FW:     795  gm    1 lb 12 oz      15  %
Gestational Age

 LMP:           25w 3d        Date:  08/12/20                 EDD:   05/19/21
 U/S Today:     26w 0d                                        EDD:   05/15/21
 Best:          26w 0d     Det. By:  Early Ultrasound         EDD:   05/15/21
                                     (10/23/20)
Anatomy

 Cranium:               Appears normal         LVOT:                   Appears normal
 Cavum:                 Appears normal         Aortic Arch:            Previously seen
 Ventricles:            Appears normal         Ductal Arch:            Previously seen
 Choroid Plexus:        Appears normal         Diaphragm:              Appears normal
 Cerebellum:            Previously seen        Stomach:                Appears normal, left
                                                                       sided
 Posterior Fossa:       Previously seen        Abdomen:                Previously seen
 Nuchal Fold:           Previously seen        Abdominal Wall:         Appears nml (cord
                                                                       insert, abd wall)
 Face:                  profile-see            Cord Vessels:           Appears normal (3
                        comments                                       vessel cord)
 Lips:                  Previously seen        Kidneys:                Appear normal
 Palate:                Not well visualized    Bladder:                Appears normal
 Thoracic:              Appears normal         Spine:                  Previously seen
 Heart:                 Appears normal; EIF    Upper Extremities:      Previously seen
 RVOT:                  Appears normal         Lower Extremities:      Previously seen

 Other:  3VTV visualized. Female gender previously seen.  Heels/feet and
         open hands/5th digits previously visualized. Technically difficult due
         to fetal position.
Cervix Uterus Adnexa

 Cervix
 Not visualized (advanced GA >52wks)

 Uterus
 Normal shape and size.

 Right Ovary
 Not visualized.

 Left Ovary
 Within normal limits.

 Cul De Sac
 No free fluid seen.

 Adnexa
 No adnexal mass visualized.
Comments

 This patient was seen for a follow up growth scan due to a
 fetus with hemoglobin SC disease that was confirmed via
 amniocentesis.  She denies any problems since her last
 exam.
 She was informed that the fetal growth and amniotic fluid
 level appears appropriate for her gestational age.  The overall
 EFW measures at the 15th percentile.
 There were no signs of hydrops noted today.
 As the overall EFW measures in the lower normal range, a
 follow-up exam was scheduled in 4 weeks.

## 2022-09-22 MED ORDER — PROMETHAZINE HCL 25 MG/ML IJ SOLN
25.0000 mg | Freq: Once | INTRAVENOUS | Status: AC
  Administered 2022-09-22: 25 mg via INTRAVENOUS
  Filled 2022-09-22: qty 1

## 2022-09-22 MED ORDER — PROMETHAZINE HCL 12.5 MG PO TABS: 12.5000 mg | ORAL_TABLET | Freq: Four times a day (QID) | ORAL | 0 refills | Status: DC | PRN

## 2022-09-22 MED ORDER — ONDANSETRON 4 MG PO TBDP: 4.0000 mg | ORAL_TABLET | Freq: Four times a day (QID) | ORAL | 5 refills | Status: AC | PRN

## 2022-09-22 MED ORDER — SODIUM CHLORIDE 0.9 % IV SOLN
8.0000 mg | Freq: Once | INTRAVENOUS | Status: AC
  Administered 2022-09-22: 8 mg via INTRAVENOUS
  Filled 2022-09-22: qty 4

## 2022-09-22 MED ORDER — PROMETHAZINE HCL 12.5 MG RE SUPP: 12.5000 mg | Freq: Four times a day (QID) | RECTAL | 0 refills | Status: AC | PRN

## 2022-09-22 MED ORDER — ACETAMINOPHEN 10 MG/ML IV SOLN
1000.0000 mg | Freq: Once | INTRAVENOUS | Status: AC
  Administered 2022-09-22: 1000 mg via INTRAVENOUS
  Filled 2022-09-22: qty 100

## 2022-09-22 NOTE — MAU Provider Note (Signed)
History     [id number]  Arrival date and time: 09/22/22 2841    Chief Complaint  Patient presents with   Nausea   Emesis     HPI Leah Vargas is a 32 y.o. at [redacted]w[redacted]d with PMHx notable for hyperemesis, anemia, sickle trait, who presents for nausea, vomiting, and leg pain.   Patient seen in the office yesterday for routine prenatal, received tdap at that visit At that time reporting some aches and pain Reports that overnight she was "rigoring" but did not check her temperature, doesn't know if she has a fever Then this morning began to have worsening nausea and vomiting Zofran is effective but last took yesterday evening No sick contacts or recent long travel (did go to [place] recently) Denies any diarrhea No runny nose, cough, sore throat, or shortness of breath Had some cramping last night but none now, no abdominal pain currently No vaginal bleeding or leaking fluid Fetal movement is normal Pain is primarily in bilateral knees, not in her calfs  A/Positive/-- (04/11 1349)  OB History     Gravida  2   Para  1   Term  1   Preterm      AB      Living  1      SAB      IAB      Ectopic      Multiple  0   Live Births  1           Past Medical History:  Diagnosis Date   Elevated lipase 10/25/2020   Hyperemesis 10/23/2020   Hypokalemia 10/25/2020   Iron deficiency anemia during pregnancy 11/11/2020   Medical history non-contributory     Past Surgical History:  Procedure Laterality Date   NO PAST SURGERIES      Family History  Problem Relation Age of Onset   Hypertension Mother    Healthy Father    Asthma Sister     Social History   Socioeconomic History   Marital status: Married    Spouse name: Nancy Marus   Number of children: Not on file   Years of education: Not on file   Highest education level: Master's degree (e.g., MA, MS, MEng, MEd, MSW, MBA)  Occupational History   Occupation: Consulting civil engineer    Comment: [employer]   Tobacco Use   Smoking status: Never    Passive exposure: Never   Smokeless tobacco: Never  Vaping Use   Vaping status: Never Used  Substance and Sexual Activity   Alcohol use: Not Currently   Drug use: Never   Sexual activity: Not Currently  Other Topics Concern   Not on file  Social History Narrative   Not on file   Social Determinants of Health   Financial Resource Strain: Not on file  Food Insecurity: No Food Insecurity (05/11/2021)   Hunger Vital Sign    Worried About Running Out of Food in the Last Year: Never true    Ran Out of Food in the Last Year: Never true  Transportation Needs: No Transportation Needs (05/11/2021)   PRAPARE - Administrator, Civil Service (Medical): No    Lack of Transportation (Non-Medical): No  Physical Activity: Not on file  Stress: Not on file  Social Connections: Not on file  Intimate Partner Violence: Not on file    No Known Allergies  No current facility-administered medications on file prior to encounter.   Current Outpatient Medications on File Prior to Encounter  Medication Sig Dispense Refill   ferrous sulfate 324 MG TBEC Take 1 tablet (324 mg total) by mouth every other day. 30 tablet 3     ROS Pertinent positives and negative per HPI, all others reviewed and negative  Physical Exam   BP 95/61   Pulse (!) 135   Temp 100 F (37.8 C)   Resp 18   LMP 02/26/2022 (Exact Date)   SpO2 100%   Patient Vitals for the past 24 hrs:  BP Temp Pulse Resp SpO2  09/22/22 0848 -- -- (!) 135 -- 100 %  09/22/22 0845 95/61 100 F (37.8 C) (!) 144 18 --    Physical Exam Vitals reviewed.  Constitutional:      General: She is not in acute distress.    Appearance: She is well-developed. She is not diaphoretic.  Eyes:     General: No scleral icterus. Pulmonary:     Effort: Pulmonary effort is normal. No respiratory distress.  Abdominal:     General: There is no distension.     Palpations: Abdomen is soft.      Tenderness: There is no abdominal tenderness. There is no guarding or rebound.  Skin:    General: Skin is warm and dry.  Neurological:     Mental Status: She is alert.     Coordination: Coordination normal.      Cervical Exam    Bedside Ultrasound Not performed.  My interpretation: n/a  FHT Baseline: 155 bpm Variability: Good {> 6 bpm) Accelerations: Reactive Decelerations: Absent Uterine activity: None Cat: I  Labs Results for orders placed or performed during the hospital encounter of 09/22/22 (from the past 24 hour(s))  Comprehensive metabolic panel     Status: Abnormal   Collection Time: 09/22/22  9:46 AM  Result Value Ref Range   Sodium 138 135 - 145 mmol/L   Potassium 3.9 3.5 - 5.1 mmol/L   Chloride 102 98 - 111 mmol/L   CO2 20 (L) 22 - 32 mmol/L   Glucose, Bld 95 70 - 99 mg/dL   BUN 5 (L) 6 - 20 mg/dL   Creatinine, Ser 1.30 0.44 - 1.00 mg/dL   Calcium 8.9 8.9 - 86.5 mg/dL   Total Protein 7.1 6.5 - 8.1 g/dL   Albumin 2.8 (L) 3.5 - 5.0 g/dL   AST 30 15 - 41 U/L   ALT 20 0 - 44 U/L   Alkaline Phosphatase 59 38 - 126 U/L   Total Bilirubin 1.0 0.3 - 1.2 mg/dL   GFR, Estimated >78 >46 mL/min   Anion gap 16 (H) 5 - 15  SARS Coronavirus 2 by RT PCR (hospital order, performed in [facility] hospital lab) *cepheid single result test* Anterior Nasal Swab     Status: None   Collection Time: 09/22/22  9:58 AM   Specimen: Anterior Nasal Swab  Result Value Ref Range   SARS Coronavirus 2 by RT PCR NEGATIVE NEGATIVE    Imaging No results found.  MAU Course  Procedures Lab Orders         SARS Coronavirus 2 by RT PCR (hospital order, performed in [facility] hospital lab) *cepheid single result test* Anterior Nasal Swab         Urinalysis, Routine w reflex microscopic -Urine, Clean Catch         Comprehensive metabolic panel    Meds ordered this encounter  Medications   promethazine (PHENERGAN) 25 mg in lactated ringers 1,000 mL infusion   ondansetron (ZOFRAN) 8  mg in sodium chloride 0.9 % 50 mL IVPB   acetaminophen (OFIRMEV) IV 1,000 mg    Order Specific Question:   Is the patient UNABLE to take oral / enteral medications?    Answer:   Yes   ondansetron (ZOFRAN-ODT) 4 MG disintegrating tablet    Sig: Take 1 tablet (4 mg total) by mouth every 6 (six) hours as needed for nausea or vomiting.    Dispense:  80 tablet    Refill:  5   promethazine (PHENERGAN) 12.5 MG tablet    Sig: Take 1 tablet (12.5 mg total) by mouth every 6 (six) hours as needed for nausea or vomiting.    Dispense:  30 tablet    Refill:  0   promethazine (PHENERGAN) 12.5 MG suppository    Sig: Place 1 suppository (12.5 mg total) rectally every 6 (six) hours as needed for nausea or vomiting.    Dispense:  12 each    Refill:  0   Imaging Orders  No imaging studies ordered today    MDM Moderate (Level 3-4)  Assessment and Plan  #Nausea and vomiting of pregnancy #[redacted] weeks gestation of pregnancy #Viral illness? Patient with acute worsening in her n/v which has been persistent throughout this pregnancy. Discussed I do not think administration of tdap is related to this or her knee pain. Treated symptomatically with improvement in symptoms and able to pass PO challenge. Also swabbed for COVID as some aspects with report of rigor, borderline temp, and joint pain c/f viral illness. COVID swab was negative but regardless may be having this as well. Recommend symptomatic treatment. Regarding leg pain it is bilateral and mostly joint, low suspicion for DVT or other acute pathology. Improved with IV tylenol (per patient request) and above treatments.   #FWB FHT Cat I NST: Reactive   Dispo: discharged to home in stable condition    Venora Maples, MD/MPH 09/22/22 12:02 PM  Allergies as of 09/22/2022   No Known Allergies      Medication List     TAKE these medications    ferrous sulfate 324 MG Tbec Take 1 tablet (324 mg total) by mouth every other day.   ondansetron  4 MG disintegrating tablet Commonly known as: ZOFRAN-ODT Take 1 tablet (4 mg total) by mouth every 6 (six) hours as needed for nausea or vomiting.   promethazine 12.5 MG tablet Commonly known as: PHENERGAN Take 1 tablet (12.5 mg total) by mouth every 6 (six) hours as needed for nausea or vomiting.   promethazine 12.5 MG suppository Commonly known as: PHENERGAN Place 1 suppository (12.5 mg total) rectally every 6 (six) hours as needed for nausea or vomiting.

## 2022-09-24 ENCOUNTER — Encounter: Payer: Medicaid Other | Admitting: Obstetrics and Gynecology

## 2022-10-05 ENCOUNTER — Ambulatory Visit (INDEPENDENT_AMBULATORY_CARE_PROVIDER_SITE_OTHER): Payer: Medicaid Other | Admitting: Medical

## 2022-10-05 ENCOUNTER — Encounter: Payer: Self-pay | Admitting: Medical

## 2022-10-05 VITALS — BP 101/65 | HR 95 | Wt 174.0 lb

## 2022-10-05 DIAGNOSIS — O99013 Anemia complicating pregnancy, third trimester: Secondary | ICD-10-CM

## 2022-10-05 DIAGNOSIS — O0990 Supervision of high risk pregnancy, unspecified, unspecified trimester: Secondary | ICD-10-CM

## 2022-10-05 DIAGNOSIS — D573 Sickle-cell trait: Secondary | ICD-10-CM

## 2022-10-05 DIAGNOSIS — Z3A31 31 weeks gestation of pregnancy: Secondary | ICD-10-CM

## 2022-10-05 NOTE — Progress Notes (Signed)
   PRENATAL VISIT NOTE  Subjective:  Leah Vargas is a 32 y.o. G2P1001 at [redacted]w[redacted]d being seen today for ongoing prenatal care.  She is currently monitored for the following issues for this high-risk pregnancy and has Hyperemesis; Sickle-cell trait (HCC); Supervision of high risk pregnancy, antepartum; Abnormal genetic test during pregnancy; and Anemia of pregnancy in third trimester on their problem list.  Patient reports backache.  Contractions: Irritability. Vag. Bleeding: None.  Movement: Present. Denies leaking of fluid.   The following portions of the patient's history were reviewed and updated as appropriate: allergies, current medications, past family history, past medical history, past social history, past surgical history and problem list.   Objective:   Vitals:   10/05/22 1129  BP: 101/65  Pulse: 95  Weight: 174 lb (78.9 kg)    Fetal Status: Fetal Heart Rate (bpm): 140 Fundal Height: 31 cm Movement: Present     General:  Alert, oriented and cooperative. Patient is in no acute distress.  Skin: Skin is warm and dry. No rash noted.   Cardiovascular: Normal heart rate noted  Respiratory: Normal respiratory effort, no problems with respiration noted  Abdomen: Soft, gravid, appropriate for gestational age.  Pain/Pressure: Absent     Pelvic: Cervical exam deferred        Extremities: Normal range of motion.  Edema: None  Mental Status: Normal mood and affect. Normal behavior. Normal judgment and thought content.   Assessment and Plan:  Pregnancy: G2P1001 at [redacted]w[redacted]d 1. Supervision of high risk pregnancy, antepartum - Declined flu shot today   2. Sickle-cell trait (HCC)  3. [redacted] weeks gestation of pregnancy  4. Back pain in pregnancy, third trimester  - Discussed hydrotherapy, heat, Tylenol and belly band   5.  Anemia of pregnancy in third trimester   Preterm labor symptoms and general obstetric precautions including but not limited to vaginal bleeding,  contractions, leaking of fluid and fetal movement were reviewed in detail with the patient. Please refer to After Visit Summary for other counseling recommendations.   Return in about 2 weeks (around 10/19/2022) for LOB, In-Person, any provider, as scheduled.  Future Appointments  Date Time Provider Department Center  10/13/2022  3:30 PM [facility] US2 [facility]  10/22/2022 10:55 AM Lorriane Shire, MD [facility] None  11/03/2022  9:55 AM Levie Heritage, DO [facility] None  11/10/2022 11:15 AM Lorriane Shire, MD [facility] None  11/19/2022 10:35 AM Lorriane Shire, MD [facility] None  11/24/2022 10:35 AM Levie Heritage, DO [facility] None  12/01/2022 10:35 AM Levie Heritage, DO [facility] None    Vonzella Nipple, PA-C

## 2022-10-13 ENCOUNTER — Other Ambulatory Visit: Payer: Self-pay | Admitting: *Deleted

## 2022-10-13 ENCOUNTER — Ambulatory Visit: Payer: Managed Care, Other (non HMO) | Attending: Obstetrics and Gynecology

## 2022-10-13 DIAGNOSIS — O3510X0 Maternal care for (suspected) chromosomal abnormality in fetus, unspecified, not applicable or unspecified: Secondary | ICD-10-CM

## 2022-10-13 DIAGNOSIS — Z3A32 32 weeks gestation of pregnancy: Secondary | ICD-10-CM | POA: Diagnosis not present

## 2022-10-13 DIAGNOSIS — Z362 Encounter for other antenatal screening follow-up: Secondary | ICD-10-CM | POA: Insufficient documentation

## 2022-10-14 ENCOUNTER — Telehealth: Payer: Self-pay | Admitting: Family Medicine

## 2022-10-14 NOTE — Telephone Encounter (Signed)
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  She recently had a bad reaction to the TDap where she ended up in the ER.  She is requesting a vaccine waiver letter from Dr. Adrian Blackwater.  She does not want to receive any further vaccines.   I informed patient, I would send her request.  Please follow up with patient.

## 2022-10-17 ENCOUNTER — Encounter: Payer: Self-pay | Admitting: Family Medicine

## 2022-10-20 NOTE — Telephone Encounter (Signed)
Addressed in My Chart message

## 2022-10-22 ENCOUNTER — Other Ambulatory Visit: Payer: Self-pay | Admitting: Obstetrics and Gynecology

## 2022-10-22 ENCOUNTER — Ambulatory Visit (INDEPENDENT_AMBULATORY_CARE_PROVIDER_SITE_OTHER): Payer: Managed Care, Other (non HMO) | Admitting: Obstetrics and Gynecology

## 2022-10-22 VITALS — BP 97/63 | HR 81 | Wt 176.0 lb

## 2022-10-22 DIAGNOSIS — O99013 Anemia complicating pregnancy, third trimester: Secondary | ICD-10-CM

## 2022-10-22 DIAGNOSIS — O99891 Other specified diseases and conditions complicating pregnancy: Secondary | ICD-10-CM

## 2022-10-22 DIAGNOSIS — M549 Dorsalgia, unspecified: Secondary | ICD-10-CM

## 2022-10-22 DIAGNOSIS — O0990 Supervision of high risk pregnancy, unspecified, unspecified trimester: Secondary | ICD-10-CM

## 2022-10-22 DIAGNOSIS — Z3A34 34 weeks gestation of pregnancy: Secondary | ICD-10-CM

## 2022-10-22 NOTE — Progress Notes (Signed)
   PRENATAL VISIT NOTE  Subjective:  Leah Vargas is a 32 y.o. G2P1001 at [redacted]w[redacted]d being seen today for ongoing prenatal care.  She is currently monitored for the following issues for this low-risk pregnancy and has Hyperemesis; Sickle-cell trait (HCC); Supervision of high risk pregnancy, antepartum; Abnormal genetic test during pregnancy; and Anemia of pregnancy in third trimester on their problem list.  Patient reports backache, fatigue, and occasional lower abdominal discomfort .  Contractions: Irritability. Vag. Bleeding: None.  Movement: Present. Denies leaking of fluid.   The following portions of the patient's history were reviewed and updated as appropriate: allergies, current medications, past family history, past medical history, past social history, past surgical history and problem list.   Objective:   Vitals:   10/22/22 1116  BP: 97/63  Pulse: 81  Weight: 176 lb (79.8 kg)    Fetal Status: Fetal Heart Rate (bpm): 140   Movement: Present     General:  Alert, oriented and cooperative. Patient is in no acute distress.  Skin: Skin is warm and dry. No rash noted.   Cardiovascular: Normal heart rate noted  Respiratory: Normal respiratory effort, no problems with respiration noted  Abdomen: Soft, gravid, appropriate for gestational age.  Pain/Pressure: Present     Pelvic: Cervical exam deferred        Extremities: Normal range of motion.  Edema: None  Mental Status: Normal mood and affect. Normal behavior. Normal judgment and thought content.   Assessment and Plan:  Pregnancy: G2P1001 at [redacted]w[redacted]d 1. Supervision of high risk pregnancy, antepartum FHR and FH wnl Growth followed by MFM due to [place]  2. Anemia of pregnancy in third trimester Asked about recieinvg iron transfusion again as she needed it in her last pregnancy  3. Back pain affecting pregnancy in third trimester Discussed conservative measures including heat, APAP, stretches and offered PT  Term labor  symptoms and general obstetric precautions including but not limited to vaginal bleeding, contractions, leaking of fluid and fetal movement were reviewed in detail with the patient. Please refer to After Visit Summary for other counseling recommendations.   No follow-ups on file.  Future Appointments  Date Time Provider Department Center  11/03/2022  9:55 AM Levie Heritage, DO [facility] None  11/10/2022 11:15 AM Marny Lowenstein, PA-C [facility] None  11/12/2022 12:30 PM [facility] US3 [facility]  11/19/2022 10:35 AM Lorriane Shire, MD [facility] None  11/24/2022 10:35 AM Levie Heritage, DO [facility] None  12/01/2022 10:35 AM Adrian Blackwater, Rhona Raider, DO [facility] None    Lorriane Shire, MD

## 2022-10-25 ENCOUNTER — Telehealth: Payer: Self-pay

## 2022-10-25 NOTE — Telephone Encounter (Signed)
Hello,  Patient will be scheduled as soon as possible.  Auth Submission: NO AUTH NEEDED Site of care: Site of care: [facility] Payer: [employer] Medicaid Medication & CPT/J Code(s) submitted: Venofer (Iron Sucrose) J1756 Route of submission (phone, fax, portal): phone Phone # Fax # Auth type: Buy/Bill PB Units/visits requested: 200mg , 3 doses Reference number:  Approval from: 10/25/22 to 02/01/23

## 2022-11-01 ENCOUNTER — Ambulatory Visit (INDEPENDENT_AMBULATORY_CARE_PROVIDER_SITE_OTHER): Payer: Managed Care, Other (non HMO)

## 2022-11-01 VITALS — BP 100/66 | HR 79 | Temp 98.5°F | Resp 16 | Ht 70.0 in | Wt 181.6 lb

## 2022-11-01 DIAGNOSIS — Z3A35 35 weeks gestation of pregnancy: Secondary | ICD-10-CM

## 2022-11-01 DIAGNOSIS — D508 Other iron deficiency anemias: Secondary | ICD-10-CM

## 2022-11-01 DIAGNOSIS — O99013 Anemia complicating pregnancy, third trimester: Secondary | ICD-10-CM

## 2022-11-01 MED ORDER — ACETAMINOPHEN 325 MG PO TABS: 650.0000 mg | ORAL_TABLET | Freq: Once | ORAL | Status: DC

## 2022-11-01 MED ORDER — SODIUM CHLORIDE 0.9 % IV SOLN
200.0000 mg | Freq: Once | INTRAVENOUS | Status: AC
  Administered 2022-11-01: 200 mg via INTRAVENOUS
  Filled 2022-11-01: qty 10

## 2022-11-01 MED ORDER — DIPHENHYDRAMINE HCL 25 MG PO CAPS: 25.0000 mg | ORAL_CAPSULE | Freq: Once | ORAL | Status: DC

## 2022-11-01 NOTE — Progress Notes (Signed)
Diagnosis: Iron Deficiency Anemia  Provider:  Chilton Greathouse MD  Procedure: IV Infusion  IV Type: Peripheral, IV Location: L Hand  Venofer (Iron Sucrose), Dose: 200 mg  Infusion Start Time: 1537  Infusion Stop Time: 1554  Post Infusion IV Care: Observation period completed and Peripheral IV Discontinued  Discharge: Condition: Good, Destination: Home . AVS Declined  Performed by:  Loney Hering, LPN

## 2022-11-03 ENCOUNTER — Ambulatory Visit: Payer: Managed Care, Other (non HMO) | Admitting: Family Medicine

## 2022-11-03 ENCOUNTER — Ambulatory Visit (INDEPENDENT_AMBULATORY_CARE_PROVIDER_SITE_OTHER): Payer: Managed Care, Other (non HMO)

## 2022-11-03 VITALS — BP 107/70 | HR 82 | Temp 98.0°F | Resp 16 | Ht 66.0 in | Wt 183.8 lb

## 2022-11-03 VITALS — BP 105/65 | HR 81 | Wt 182.0 lb

## 2022-11-03 DIAGNOSIS — Z3A35 35 weeks gestation of pregnancy: Secondary | ICD-10-CM

## 2022-11-03 DIAGNOSIS — D508 Other iron deficiency anemias: Secondary | ICD-10-CM

## 2022-11-03 DIAGNOSIS — O0990 Supervision of high risk pregnancy, unspecified, unspecified trimester: Secondary | ICD-10-CM

## 2022-11-03 DIAGNOSIS — O0993 Supervision of high risk pregnancy, unspecified, third trimester: Secondary | ICD-10-CM

## 2022-11-03 DIAGNOSIS — O99013 Anemia complicating pregnancy, third trimester: Secondary | ICD-10-CM

## 2022-11-03 DIAGNOSIS — D573 Sickle-cell trait: Secondary | ICD-10-CM

## 2022-11-03 DIAGNOSIS — O285 Abnormal chromosomal and genetic finding on antenatal screening of mother: Secondary | ICD-10-CM

## 2022-11-03 MED ORDER — DIPHENHYDRAMINE HCL 25 MG PO CAPS: 25.0000 mg | ORAL_CAPSULE | Freq: Once | ORAL | Status: DC

## 2022-11-03 MED ORDER — SODIUM CHLORIDE 0.9 % IV SOLN
200.0000 mg | Freq: Once | INTRAVENOUS | Status: AC
  Administered 2022-11-03: 200 mg via INTRAVENOUS
  Filled 2022-11-03: qty 10

## 2022-11-03 MED ORDER — ACETAMINOPHEN 325 MG PO TABS: 650.0000 mg | ORAL_TABLET | Freq: Once | ORAL | Status: DC

## 2022-11-03 NOTE — Progress Notes (Signed)
Diagnosis: Acute Anemia  Provider:  Chilton Greathouse MD  Procedure: IV Infusion  IV Type: Peripheral, IV Location: L Forearm  Venofer (Iron Sucrose), Dose: 200 mg  Infusion Start Time: 1514  Infusion Stop Time: 1535  Post Infusion IV Care: Observation period completed and Peripheral IV Discontinued  Discharge: Condition: Good, Destination: Home . AVS Declined  Performed by:  Wyvonne Lenz, RN

## 2022-11-03 NOTE — Progress Notes (Signed)
   PRENATAL VISIT NOTE  Subjective:  Leah Vargas is a 32 y.o. G2P1001 at [redacted]w[redacted]d being seen today for ongoing prenatal care.  She is currently monitored for the following issues for this low-risk pregnancy and has Hyperemesis; Sickle-cell trait (HCC); Supervision of high risk pregnancy, antepartum; Abnormal genetic test during pregnancy; and Anemia of pregnancy in third trimester on their problem list.  Patient reports no complaints.  Contractions: Irritability. Vag. Bleeding: None.  Movement: Present. Denies leaking of fluid.   The following portions of the patient's history were reviewed and updated as appropriate: allergies, current medications, past family history, past medical history, past social history, past surgical history and problem list.   Objective:   Vitals:   11/03/22 1003  BP: 105/65  Pulse: 81  Weight: 182 lb (82.6 kg)    Fetal Status: Fetal Heart Rate (bpm): 135   Movement: Present     General:  Alert, oriented and cooperative. Patient is in no acute distress.  Skin: Skin is warm and dry. No rash noted.   Cardiovascular: Normal heart rate noted  Respiratory: Normal respiratory effort, no problems with respiration noted  Abdomen: Soft, gravid, appropriate for gestational age.  Pain/Pressure: Present     Pelvic: Cervical exam deferred        Extremities: Normal range of motion.  Edema: None  Mental Status: Normal mood and affect. Normal behavior. Normal judgment and thought content.   Assessment and Plan:  Pregnancy: G2P1001 at [redacted]w[redacted]d 1. Supervision of high risk pregnancy, antepartum FHT normal  2. Sickle-cell trait (HCC)  3. Anemia of pregnancy in third trimester Had iron infusion. Has follow up infusions.  4. Abnormal genetic test during pregnancy Baby with sickle cell disease  Preterm labor symptoms and general obstetric precautions including but not limited to vaginal bleeding, contractions, leaking of fluid and fetal movement were reviewed  in detail with the patient. Please refer to After Visit Summary for other counseling recommendations.   No follow-ups on file.  Future Appointments  Date Time Provider Department Center  11/03/2022  3:00 PM [facility] None  11/05/2022  3:00 PM [facility] None  11/10/2022 11:15 AM Marny Lowenstein, PA-C [facility] None  11/12/2022 12:30 PM [facility] US3 [facility]  11/19/2022 10:35 AM Lorriane Shire, MD [facility] None  11/24/2022 10:35 AM Levie Heritage, DO [facility] None  12/01/2022 10:35 AM Adrian Blackwater Rhona Raider, DO [facility] None    Levie Heritage, DO

## 2022-11-04 ENCOUNTER — Encounter: Payer: Medicaid Other | Admitting: Family Medicine

## 2022-11-05 ENCOUNTER — Ambulatory Visit: Payer: Managed Care, Other (non HMO)

## 2022-11-05 VITALS — BP 97/61 | HR 87 | Temp 98.1°F | Resp 20 | Ht 66.0 in | Wt 183.2 lb

## 2022-11-05 DIAGNOSIS — O99013 Anemia complicating pregnancy, third trimester: Secondary | ICD-10-CM

## 2022-11-05 DIAGNOSIS — Z3A36 36 weeks gestation of pregnancy: Secondary | ICD-10-CM

## 2022-11-05 DIAGNOSIS — D508 Other iron deficiency anemias: Secondary | ICD-10-CM | POA: Diagnosis not present

## 2022-11-05 MED ORDER — SODIUM CHLORIDE 0.9 % IV SOLN
200.0000 mg | Freq: Once | INTRAVENOUS | Status: AC
  Administered 2022-11-05: 200 mg via INTRAVENOUS
  Filled 2022-11-05: qty 10

## 2022-11-05 MED ORDER — ACETAMINOPHEN 325 MG PO TABS: 650.0000 mg | ORAL_TABLET | Freq: Once | ORAL | Status: DC

## 2022-11-05 MED ORDER — DIPHENHYDRAMINE HCL 25 MG PO CAPS: 25.0000 mg | ORAL_CAPSULE | Freq: Once | ORAL | Status: DC

## 2022-11-05 NOTE — Progress Notes (Signed)
Diagnosis: Acute Anemia  Provider:  Chilton Greathouse MD  Procedure: IV Infusion  IV Type: Peripheral, IV Location: L Forearm  Venofer (Iron Sucrose), Dose: 200 mg  Infusion Start Time: 1509  Infusion Stop Time: 1525  Post Infusion IV Care: Observation period completed and Peripheral IV Discontinued  Discharge: Condition: Good, Destination: Home . AVS Declined  Performed by:  Nat Math, RN

## 2022-11-10 ENCOUNTER — Ambulatory Visit (INDEPENDENT_AMBULATORY_CARE_PROVIDER_SITE_OTHER): Payer: Managed Care, Other (non HMO) | Admitting: Medical

## 2022-11-10 ENCOUNTER — Other Ambulatory Visit (HOSPITAL_COMMUNITY)
Admission: RE | Admit: 2022-11-10 | Discharge: 2022-11-10 | Disposition: A | Discharge status: Home / Self Care | Payer: Managed Care, Other (non HMO) | Source: Ambulatory Visit | Attending: Medical | Admitting: Medical

## 2022-11-10 ENCOUNTER — Encounter: Payer: Self-pay | Admitting: Medical

## 2022-11-10 VITALS — BP 99/68 | HR 75 | Wt 181.0 lb

## 2022-11-10 DIAGNOSIS — D573 Sickle-cell trait: Secondary | ICD-10-CM

## 2022-11-10 DIAGNOSIS — Z3A36 36 weeks gestation of pregnancy: Secondary | ICD-10-CM | POA: Diagnosis not present

## 2022-11-10 DIAGNOSIS — O285 Abnormal chromosomal and genetic finding on antenatal screening of mother: Secondary | ICD-10-CM

## 2022-11-10 DIAGNOSIS — O0993 Supervision of high risk pregnancy, unspecified, third trimester: Secondary | ICD-10-CM | POA: Insufficient documentation

## 2022-11-10 DIAGNOSIS — O0990 Supervision of high risk pregnancy, unspecified, unspecified trimester: Secondary | ICD-10-CM

## 2022-11-10 DIAGNOSIS — O99013 Anemia complicating pregnancy, third trimester: Secondary | ICD-10-CM

## 2022-11-10 NOTE — Progress Notes (Signed)
   PRENATAL VISIT NOTE  Subjective:  Leah Vargas is a 32 y.o. G2P1001 at [redacted]w[redacted]d being seen today for ongoing prenatal care.  She is currently monitored for the following issues for this high-risk pregnancy and has Hyperemesis; Sickle-cell trait (HCC); Supervision of high risk pregnancy, antepartum; Abnormal genetic test during pregnancy; and Anemia of pregnancy in third trimester on their problem list.  Patient reports backache and occasional contractions.  Contractions: Irritability. Vag. Bleeding: None.  Movement: Present. Denies leaking of fluid.   The following portions of the patient's history were reviewed and updated as appropriate: allergies, current medications, past family history, past medical history, past social history, past surgical history and problem list.   Objective:   Vitals:   11/10/22 1125  BP: 99/68  Pulse: 75  Weight: 181 lb (82.1 kg)    Fetal Status: Fetal Heart Rate (bpm): 143 Fundal Height: 37 cm Movement: Present     General:  Alert, oriented and cooperative. Patient is in no acute distress.  Skin: Skin is warm and dry. No rash noted.   Cardiovascular: Normal heart rate noted  Respiratory: Normal respiratory effort, no problems with respiration noted  Abdomen: Soft, gravid, appropriate for gestational age.  Pain/Pressure: Present     Pelvic: Cervical exam deferred        Extremities: Normal range of motion.  Edema: None  Mental Status: Normal mood and affect. Normal behavior. Normal judgment and thought content.   Assessment and Plan:  Pregnancy: G2P1001 at [redacted]w[redacted]d 1. Supervision of high risk pregnancy, antepartum - Culture, beta strep (group b only) - GC/Chlamydia probe amp ([place])not at [facility]  2. Sickle-cell trait (HCC) - Follow-up US 10/11  3. Abnormal genetic test during pregnancy - Sickle Cell confirmed on amnio  4. Anemia of pregnancy in third trimester - Multiple doses of Venofer, 2 doses since last visit   5. [redacted] weeks  gestation of pregnancy   Preterm labor symptoms and general obstetric precautions including but not limited to vaginal bleeding, contractions, leaking of fluid and fetal movement were reviewed in detail with the patient. Please refer to After Visit Summary for other counseling recommendations.   Return in about 1 week (around 11/17/2022) for LOB, In-Person, any provider.  Future Appointments  Date Time Provider Department Center  11/12/2022 12:30 PM [facility] US3 [facility]  11/19/2022 10:35 AM Lorriane Shire, MD [facility] None  11/24/2022 10:35 AM Levie Heritage, DO [facility] None  12/01/2022 10:35 AM Adrian Blackwater Rhona Raider, DO [facility] None    Vonzella Nipple, PA-C

## 2022-11-12 ENCOUNTER — Ambulatory Visit: Payer: Managed Care, Other (non HMO) | Attending: Maternal & Fetal Medicine

## 2022-11-12 DIAGNOSIS — D573 Sickle-cell trait: Secondary | ICD-10-CM | POA: Diagnosis not present

## 2022-11-12 DIAGNOSIS — O3510X0 Maternal care for (suspected) chromosomal abnormality in fetus, unspecified, not applicable or unspecified: Secondary | ICD-10-CM

## 2022-11-12 DIAGNOSIS — Z3A37 37 weeks gestation of pregnancy: Secondary | ICD-10-CM | POA: Diagnosis not present

## 2022-11-12 LAB — GC/CHLAMYDIA PROBE AMP (~~LOC~~) NOT AT ARMC
Chlamydia: NEGATIVE
Comment: NEGATIVE
Comment: NORMAL
Neisseria Gonorrhea: NEGATIVE

## 2022-11-14 LAB — CULTURE, BETA STREP (GROUP B ONLY): Strep Gp B Culture: NEGATIVE

## 2022-11-19 ENCOUNTER — Encounter: Payer: Self-pay | Admitting: Obstetrics and Gynecology

## 2022-11-19 ENCOUNTER — Ambulatory Visit (INDEPENDENT_AMBULATORY_CARE_PROVIDER_SITE_OTHER): Payer: Managed Care, Other (non HMO) | Admitting: Obstetrics and Gynecology

## 2022-11-19 VITALS — BP 108/62 | HR 78 | Wt 189.0 lb

## 2022-11-19 DIAGNOSIS — Z3A38 38 weeks gestation of pregnancy: Secondary | ICD-10-CM

## 2022-11-19 DIAGNOSIS — O99013 Anemia complicating pregnancy, third trimester: Secondary | ICD-10-CM

## 2022-11-19 DIAGNOSIS — D573 Sickle-cell trait: Secondary | ICD-10-CM

## 2022-11-19 DIAGNOSIS — O0990 Supervision of high risk pregnancy, unspecified, unspecified trimester: Secondary | ICD-10-CM

## 2022-11-19 NOTE — Progress Notes (Addendum)
   PRENATAL VISIT NOTE  Subjective:  Leah Vargas is a 32 y.o. G2P1001 at [redacted]w[redacted]d being seen today for ongoing prenatal care.  She is currently monitored for the following issues for this low-risk pregnancy and has Hyperemesis; Sickle-cell trait (HCC); Supervision of high risk pregnancy, antepartum; Abnormal genetic test during pregnancy; and Anemia of pregnancy in third trimester on their problem list.  Patient reports no complaints.  Contractions: Irritability. Vag. Bleeding: None.  Movement: Present. Denies leaking of fluid.   The following portions of the patient's history were reviewed and updated as appropriate: allergies, current medications, past family history, past medical history, past social history, past surgical history and problem list.   Objective:   Vitals:   11/19/22 1038  BP: 108/62  Pulse: 78  Weight: 189 lb (85.7 kg)    Fetal Status: Fetal Heart Rate (bpm): 141   Movement: Present     General:  Alert, oriented and cooperative. Patient is in no acute distress.  Skin: Skin is warm and dry. No rash noted.   Cardiovascular: Normal heart rate noted  Respiratory: Normal respiratory effort, no problems with respiration noted  Abdomen: Soft, gravid, appropriate for gestational age.  Pain/Pressure: Present     Pelvic: Cervical exam deferred        Extremities: Normal range of motion.  Edema: None  Mental Status: Normal mood and affect. Normal behavior. Normal judgment and thought content.   Assessment and Plan:  Pregnancy: G2P1001 at [redacted]w[redacted]d 1. [redacted] weeks gestation of pregnancy FHR wnl  2. Supervision of high risk pregnancy, antepartum Leaning towards paragard IUD for PP contraception  - CBC  3. Sickle-cell trait (HCC) Known dx of fetus - peds to be notified at time of delivery  Normal growth on 10/11 [place]  4. Anemia of pregnancy in third trimester Will re-check CBC today  - CBC  Term labor symptoms and general obstetric precautions including but not  limited to vaginal bleeding, contractions, leaking of fluid and fetal movement were reviewed in detail with the patient. Please refer to After Visit Summary for other counseling recommendations.   No follow-ups on file.  Future Appointments  Date Time Provider Department Center  11/24/2022 10:35 AM Levie Heritage, DO [facility] None  12/01/2022 10:35 AM Adrian Blackwater, Rhona Raider, DO [facility] None    Lorriane Shire, MD

## 2022-11-20 LAB — CBC
Hematocrit: 33.7 % — ABNORMAL LOW (ref 34.0–46.6)
Hemoglobin: 10.3 g/dL — ABNORMAL LOW (ref 11.1–15.9)
MCH: 24.4 pg — ABNORMAL LOW (ref 26.6–33.0)
MCHC: 30.6 g/dL — ABNORMAL LOW (ref 31.5–35.7)
MCV: 80 fL (ref 79–97)
Platelets: 228 10*3/uL (ref 150–450)
RBC: 4.22 x10E6/uL (ref 3.77–5.28)
RDW: 20.8 % — ABNORMAL HIGH (ref 11.7–15.4)
WBC: 5.4 10*3/uL (ref 3.4–10.8)

## 2022-11-24 ENCOUNTER — Ambulatory Visit (INDEPENDENT_AMBULATORY_CARE_PROVIDER_SITE_OTHER): Payer: Medicaid Other | Admitting: Family Medicine

## 2022-11-24 VITALS — BP 101/64 | HR 75 | Wt 187.0 lb

## 2022-11-24 DIAGNOSIS — O99013 Anemia complicating pregnancy, third trimester: Secondary | ICD-10-CM

## 2022-11-24 DIAGNOSIS — O285 Abnormal chromosomal and genetic finding on antenatal screening of mother: Secondary | ICD-10-CM

## 2022-11-24 DIAGNOSIS — O0990 Supervision of high risk pregnancy, unspecified, unspecified trimester: Secondary | ICD-10-CM

## 2022-11-24 DIAGNOSIS — Z3A38 38 weeks gestation of pregnancy: Secondary | ICD-10-CM

## 2022-11-24 DIAGNOSIS — D573 Sickle-cell trait: Secondary | ICD-10-CM

## 2022-11-24 NOTE — Progress Notes (Signed)
   PRENATAL VISIT NOTE  Subjective:  Leah Vargas is a 32 y.o. G2P1001 at [redacted]w[redacted]d being seen today for ongoing prenatal care.  She is currently monitored for the following issues for this high-risk pregnancy and has Hyperemesis; Sickle-cell trait (HCC); Supervision of high risk pregnancy, antepartum; Abnormal genetic test during pregnancy; and Anemia of pregnancy in third trimester on their problem list.  Patient reports fatigue.  Contractions: Irritability. Vag. Bleeding: None.  Movement: Present. Denies leaking of fluid.   The following portions of the patient's history were reviewed and updated as appropriate: allergies, current medications, past family history, past medical history, past social history, past surgical history and problem list.   Objective:   Vitals:   11/24/22 1053  BP: 101/64  Pulse: 75  Weight: 187 lb (84.8 kg)    Fetal Status: Fetal Heart Rate (bpm): 130 Fundal Height: 38 cm Movement: Present  Presentation: Vertex  General:  Alert, oriented and cooperative. Patient is in no acute distress.  Skin: Skin is warm and dry. No rash noted.   Cardiovascular: Normal heart rate noted  Respiratory: Normal respiratory effort, no problems with respiration noted  Abdomen: Soft, gravid, appropriate for gestational age.  Pain/Pressure: Present     Pelvic: Cervical exam deferred        Extremities: Normal range of motion.  Edema: None  Mental Status: Normal mood and affect. Normal behavior. Normal judgment and thought content.   Assessment and Plan:  Pregnancy: G2P1001 at [redacted]w[redacted]d 1. [redacted] weeks gestation of pregnancy  2. Supervision of high risk pregnancy, antepartum FHT normal EFW 80%  3. Sickle-cell trait (HCC)  4. Abnormal genetic test during pregnancy Confirmed sickle cell by amnio  5. Anemia of pregnancy in third trimester On iron.  Term labor symptoms and general obstetric precautions including but not limited to vaginal bleeding, contractions,  leaking of fluid and fetal movement were reviewed in detail with the patient. Please refer to After Visit Summary for other counseling recommendations.   No follow-ups on file.  Future Appointments  Date Time Provider Department Center  12/01/2022 10:35 AM Levie Heritage, DO [facility] None    Levie Heritage, DO

## 2022-11-29 ENCOUNTER — Encounter (HOSPITAL_COMMUNITY): Payer: Self-pay | Admitting: Obstetrics and Gynecology

## 2022-11-29 ENCOUNTER — Inpatient Hospital Stay (HOSPITAL_COMMUNITY)
Admission: AD | Admit: 2022-11-29 | Discharge: 2022-11-29 | Disposition: A | Discharge status: Home / Self Care | Payer: Managed Care, Other (non HMO) | Attending: Obstetrics and Gynecology | Admitting: Obstetrics and Gynecology

## 2022-11-29 DIAGNOSIS — O471 False labor at or after 37 completed weeks of gestation: Secondary | ICD-10-CM | POA: Insufficient documentation

## 2022-11-29 DIAGNOSIS — Z3A39 39 weeks gestation of pregnancy: Secondary | ICD-10-CM | POA: Diagnosis not present

## 2022-11-29 DIAGNOSIS — O0990 Supervision of high risk pregnancy, unspecified, unspecified trimester: Secondary | ICD-10-CM

## 2022-11-29 NOTE — MAU Note (Signed)
Pt says UC's at 6pm. [facility] - no VE Denies HSV GBS- neg

## 2022-12-01 ENCOUNTER — Ambulatory Visit (INDEPENDENT_AMBULATORY_CARE_PROVIDER_SITE_OTHER): Payer: Medicaid Other | Admitting: Family Medicine

## 2022-12-01 VITALS — BP 104/61 | HR 78 | Wt 190.0 lb

## 2022-12-01 DIAGNOSIS — O99013 Anemia complicating pregnancy, third trimester: Secondary | ICD-10-CM

## 2022-12-01 DIAGNOSIS — R1110 Vomiting, unspecified: Secondary | ICD-10-CM

## 2022-12-01 DIAGNOSIS — O285 Abnormal chromosomal and genetic finding on antenatal screening of mother: Secondary | ICD-10-CM

## 2022-12-01 DIAGNOSIS — D573 Sickle-cell trait: Secondary | ICD-10-CM

## 2022-12-01 DIAGNOSIS — O0990 Supervision of high risk pregnancy, unspecified, unspecified trimester: Secondary | ICD-10-CM

## 2022-12-01 NOTE — Addendum Note (Signed)
Addended by: Levie Heritage on: 12/01/2022 12:14 PM   Modules accepted: Orders

## 2022-12-01 NOTE — Progress Notes (Signed)
   PRENATAL VISIT NOTE  Subjective:  Leah Vargas is a 32 y.o. G2P1001 at [redacted]w[redacted]d being seen today for ongoing prenatal care.  She is currently monitored for the following issues for this high-risk pregnancy and has Hyperemesis; Sickle-cell trait (HCC); Supervision of high risk pregnancy, antepartum; Abnormal genetic test during pregnancy; and Anemia of pregnancy in third trimester on their problem list.  Patient reports nausea and occasional contractions.  Contractions: Irritability. Vag. Bleeding: None.  Movement: Present. Denies leaking of fluid.   The following portions of the patient's history were reviewed and updated as appropriate: allergies, current medications, past family history, past medical history, past social history, past surgical history and problem list.   Objective:   Vitals:   12/01/22 1052  BP: 104/61  Pulse: 78  Weight: 190 lb (86.2 kg)    Fetal Status: Fetal Heart Rate (bpm): 150   Movement: Present     General:  Alert, oriented and cooperative. Patient is in no acute distress.  Skin: Skin is warm and dry. No rash noted.   Cardiovascular: Normal heart rate noted  Respiratory: Normal respiratory effort, no problems with respiration noted  Abdomen: Soft, gravid, appropriate for gestational age.  Pain/Pressure: Present     Pelvic: Cervical exam deferred        Extremities: Normal range of motion.  Edema: None  Mental Status: Normal mood and affect. Normal behavior. Normal judgment and thought content.   Assessment and Plan:  Pregnancy: G2P1001 at [redacted]w[redacted]d 1. Supervision of high risk pregnancy, antepartum FHT normal BPP for postdates Patient would like induction as soon as possible due to continued nausea, discomforts, etc. Will set up for elective induction and schedule for medical induction if elective induction not possible. - US FETAL BPP W/NONSTRESS; Future  2. Sickle-cell trait (HCC)  3. Hyperemesis  4. Abnormal genetic test during  pregnancy Baby with sickle cell disease confirmed by amnio  5. Anemia of pregnancy in third trimester   Term labor symptoms and general obstetric precautions including but not limited to vaginal bleeding, contractions, leaking of fluid and fetal movement were reviewed in detail with the patient. Please refer to After Visit Summary for other counseling recommendations.   No follow-ups on file.  Future Appointments  Date Time Provider Department Center  12/06/2022 10:45 AM [facility] US2 [facility]  12/08/2022  9:55 AM Levie Heritage, DO [facility] None  12/10/2022  7:00 AM [facility] SCHED ROOM [facility] None    Levie Heritage, DO

## 2022-12-03 ENCOUNTER — Encounter (HOSPITAL_COMMUNITY): Payer: Self-pay | Admitting: *Deleted

## 2022-12-03 ENCOUNTER — Inpatient Hospital Stay (HOSPITAL_COMMUNITY)
Admission: RE | Admit: 2022-12-03 | Discharge: 2022-12-05 | DRG: 805 | Disposition: A | Discharge status: Home / Self Care | Payer: Managed Care, Other (non HMO) | Attending: Obstetrics and Gynecology | Admitting: Obstetrics and Gynecology

## 2022-12-03 ENCOUNTER — Telehealth (HOSPITAL_COMMUNITY): Payer: Self-pay | Admitting: *Deleted

## 2022-12-03 ENCOUNTER — Other Ambulatory Visit: Payer: Self-pay

## 2022-12-03 ENCOUNTER — Encounter (HOSPITAL_COMMUNITY): Payer: Self-pay | Admitting: Family Medicine

## 2022-12-03 DIAGNOSIS — O4590 Premature separation of placenta, unspecified, unspecified trimester: Secondary | ICD-10-CM | POA: Insufficient documentation

## 2022-12-03 DIAGNOSIS — K219 Gastro-esophageal reflux disease without esophagitis: Secondary | ICD-10-CM | POA: Diagnosis present

## 2022-12-03 DIAGNOSIS — Z3A40 40 weeks gestation of pregnancy: Secondary | ICD-10-CM

## 2022-12-03 DIAGNOSIS — Z8249 Family history of ischemic heart disease and other diseases of the circulatory system: Secondary | ICD-10-CM | POA: Diagnosis not present

## 2022-12-03 DIAGNOSIS — O9902 Anemia complicating childbirth: Secondary | ICD-10-CM | POA: Diagnosis not present

## 2022-12-03 DIAGNOSIS — O480 Post-term pregnancy: Secondary | ICD-10-CM | POA: Diagnosis present

## 2022-12-03 DIAGNOSIS — O99013 Anemia complicating pregnancy, third trimester: Secondary | ICD-10-CM | POA: Diagnosis present

## 2022-12-03 DIAGNOSIS — O210 Mild hyperemesis gravidarum: Principal | ICD-10-CM | POA: Diagnosis present

## 2022-12-03 DIAGNOSIS — O9962 Diseases of the digestive system complicating childbirth: Secondary | ICD-10-CM | POA: Diagnosis present

## 2022-12-03 DIAGNOSIS — O99214 Obesity complicating childbirth: Secondary | ICD-10-CM | POA: Diagnosis present

## 2022-12-03 DIAGNOSIS — Z825 Family history of asthma and other chronic lower respiratory diseases: Secondary | ICD-10-CM

## 2022-12-03 DIAGNOSIS — O4593 Premature separation of placenta, unspecified, third trimester: Secondary | ICD-10-CM | POA: Diagnosis present

## 2022-12-03 DIAGNOSIS — Z3490 Encounter for supervision of normal pregnancy, unspecified, unspecified trimester: Principal | ICD-10-CM | POA: Diagnosis present

## 2022-12-03 DIAGNOSIS — D62 Acute posthemorrhagic anemia: Secondary | ICD-10-CM | POA: Diagnosis not present

## 2022-12-03 DIAGNOSIS — O9081 Anemia of the puerperium: Secondary | ICD-10-CM | POA: Diagnosis not present

## 2022-12-03 DIAGNOSIS — O0990 Supervision of high risk pregnancy, unspecified, unspecified trimester: Secondary | ICD-10-CM

## 2022-12-03 DIAGNOSIS — D573 Sickle-cell trait: Secondary | ICD-10-CM | POA: Diagnosis present

## 2022-12-03 DIAGNOSIS — O285 Abnormal chromosomal and genetic finding on antenatal screening of mother: Secondary | ICD-10-CM

## 2022-12-03 LAB — CBC
HCT: 33.1 % — ABNORMAL LOW (ref 36.0–46.0)
Hemoglobin: 10.4 g/dL — ABNORMAL LOW (ref 12.0–15.0)
MCH: 24.2 pg — ABNORMAL LOW (ref 26.0–34.0)
MCHC: 31.4 g/dL (ref 30.0–36.0)
MCV: 77.2 fL — ABNORMAL LOW (ref 80.0–100.0)
Platelets: 213 10*3/uL (ref 150–400)
RBC: 4.29 MIL/uL (ref 3.87–5.11)
RDW: 21 % — ABNORMAL HIGH (ref 11.5–15.5)
WBC: 6.4 10*3/uL (ref 4.0–10.5)
nRBC: 0 % (ref 0.0–0.2)

## 2022-12-03 LAB — TYPE AND SCREEN
ABO/RH(D): A POS
Antibody Screen: NEGATIVE

## 2022-12-03 MED ORDER — OXYCODONE-ACETAMINOPHEN 5-325 MG PO TABS: 1.0000 | ORAL_TABLET | ORAL | Status: DC | PRN

## 2022-12-03 MED ORDER — LACTATED RINGERS IV SOLN: INTRAVENOUS | Status: DC

## 2022-12-03 MED ORDER — MISOPROSTOL 25 MCG QUARTER TABLET: 25.0000 ug | ORAL_TABLET | ORAL | Status: DC | PRN

## 2022-12-03 MED ORDER — ONDANSETRON HCL 4 MG/2ML IJ SOLN: 4.0000 mg | Freq: Four times a day (QID) | INTRAMUSCULAR | Status: DC | PRN

## 2022-12-03 MED ORDER — OXYTOCIN BOLUS FROM INFUSION
333.0000 mL | Freq: Once | INTRAVENOUS | Status: AC
  Administered 2022-12-04: 333 mL via INTRAVENOUS

## 2022-12-03 MED ORDER — MISOPROSTOL 50MCG HALF TABLET
50.0000 ug | ORAL_TABLET | Freq: Once | ORAL | Status: AC
  Administered 2022-12-03: 50 ug via ORAL
  Filled 2022-12-03: qty 1

## 2022-12-03 MED ORDER — MISOPROSTOL 25 MCG QUARTER TABLET
25.0000 ug | ORAL_TABLET | Freq: Once | ORAL | Status: AC
  Administered 2022-12-03: 25 ug via VAGINAL
  Filled 2022-12-03: qty 1

## 2022-12-03 MED ORDER — SOD CITRATE-CITRIC ACID 500-334 MG/5ML PO SOLN: 30.0000 mL | ORAL | Status: DC | PRN

## 2022-12-03 MED ORDER — OXYCODONE-ACETAMINOPHEN 5-325 MG PO TABS: 2.0000 | ORAL_TABLET | ORAL | Status: DC | PRN

## 2022-12-03 MED ORDER — FENTANYL CITRATE (PF) 100 MCG/2ML IJ SOLN
50.0000 ug | INTRAMUSCULAR | Status: DC | PRN
  Administered 2022-12-03: 50 ug via INTRAVENOUS
  Filled 2022-12-03: qty 2

## 2022-12-03 MED ORDER — TERBUTALINE SULFATE 1 MG/ML IJ SOLN: 0.2500 mg | Freq: Once | INTRAMUSCULAR | Status: DC | PRN

## 2022-12-03 MED ORDER — LACTATED RINGERS IV SOLN
500.0000 mL | INTRAVENOUS | Status: DC | PRN
  Administered 2022-12-03: 1000 mL via INTRAVENOUS
  Administered 2022-12-04 (×2): 500 mL via INTRAVENOUS

## 2022-12-03 MED ORDER — OXYTOCIN-SODIUM CHLORIDE 30-0.9 UT/500ML-% IV SOLN
2.5000 [IU]/h | INTRAVENOUS | Status: DC
  Filled 2022-12-03: qty 500

## 2022-12-03 MED ORDER — ACETAMINOPHEN 325 MG PO TABS: 650.0000 mg | ORAL_TABLET | ORAL | Status: DC | PRN

## 2022-12-03 MED ORDER — LIDOCAINE HCL (PF) 1 % IJ SOLN
30.0000 mL | INTRAMUSCULAR | Status: AC | PRN
  Administered 2022-12-04: 30 mL via SUBCUTANEOUS
  Filled 2022-12-03: qty 30

## 2022-12-03 NOTE — Telephone Encounter (Signed)
Preadmission screen  

## 2022-12-03 NOTE — H&P (Addendum)
Patient ID: Leah Vargas, female   DOB: 10-20-1990, 32 y.o.   MRN: [id number]  OBSTETRIC ADMISSION HISTORY AND PHYSICAL  Leah Vargas is a 32 y.o. female G2P1001 with IUP at [redacted]w[redacted]d by LMP presenting for elective IOL due to hyperemesis. Fetus has S/C disease (confirmed via amniocentesis). She reports +FMs, No LOF, no VB, no blurry vision, headaches or peripheral edema, and RUQ pain.  She plans on breast feeding, is open to formula feeding. She request outpatient non-hormonal IUD for birth control. She [facility] her [facility].   Dating: By LMP --->  Estimated Date of Delivery: 12/03/22  Sono:    @[redacted]w[redacted]d , CWD, normal anatomy, Cephalic presentation, Posterior Fundal lie, 3350 g, 80% EFW   Prenatal History/Complications:  1 Hyperemesis  2. Sickle Cell Trait 3. Anemia of Pregnancy in third trimester  4. Baby with Sickle Cell disease confirmed by amniocentesis   Past Medical History: Past Medical History:  Diagnosis Date   Elevated lipase 10/25/2020   Hyperemesis 10/23/2020   Hypokalemia 10/25/2020   Iron deficiency anemia during pregnancy 11/11/2020   Medical history non-contributory     Past Surgical History: Past Surgical History:  Procedure Laterality Date   NO PAST SURGERIES      Obstetrical History: OB History     Gravida  2   Para  1   Term  1   Preterm      AB      Living  1      SAB      IAB      Ectopic      Multiple  0   Live Births  1           Social History Social History   Socioeconomic History   Marital status: Married    Spouse name: Nancy Marus   Number of children: Not on file   Years of education: Not on file   Highest education level: Master's degree (e.g., MA, MS, MEng, MEd, MSW, MBA)  Occupational History   Occupation: Consulting civil engineer    Comment: [employer]  Tobacco Use   Smoking status: Never    Passive exposure: Never   Smokeless tobacco: Never  Vaping Use   Vaping status: Never  Used  Substance and Sexual Activity   Alcohol use: Not Currently   Drug use: Never   Sexual activity: Not Currently  Other Topics Concern   Not on file  Social History Narrative   Not on file   Social Determinants of Health   Financial Resource Strain: Not on file  Food Insecurity: No Food Insecurity (05/11/2021)   Hunger Vital Sign    Worried About Running Out of Food in the Last Year: Never true    Ran Out of Food in the Last Year: Never true  Transportation Needs: No Transportation Needs (05/11/2021)   PRAPARE - Administrator, Civil Service (Medical): No    Lack of Transportation (Non-Medical): No  Physical Activity: Not on file  Stress: Not on file  Social Connections: Not on file    Family History: Family History  Problem Relation Age of Onset   Hypertension Mother    Healthy Father    Asthma Sister     Allergies: No Known Allergies  Medications Prior to Admission  Medication Sig Dispense Refill Last Dose   ferrous sulfate 324 MG TBEC Take 1 tablet (324 mg total) by mouth every other day. 30 tablet 3    ondansetron (ZOFRAN-ODT)  4 MG disintegrating tablet Take 1 tablet (4 mg total) by mouth every 6 (six) hours as needed for nausea or vomiting. (Patient not taking: Reported on 10/22/2022) 80 tablet 5    promethazine (PHENERGAN) 12.5 MG suppository Place 1 suppository (12.5 mg total) rectally every 6 (six) hours as needed for nausea or vomiting. (Patient not taking: Reported on 10/05/2022) 12 each 0      Review of Systems   All systems reviewed and negative except as stated in HPI  Last menstrual period 02/26/2022, currently breastfeeding. General appearance: alert and cooperative Lungs: clear to auscultation bilaterally Heart: regular rate and rhythm Abdomen: soft, non-tender; bowel sounds normal Pelvic: Cervical exam deferred  Extremities: Homans sign is negative, no sign of DVT Presentation: cephalic Fetal monitoringBaseline: 140 bpm, Variability:  Good {> 6 bpm), and Accelerations: Reactive Uterine activityFrequency: Every 20 minutes    Prenatal labs: ABO, Rh: A/Positive/-- (04/11 1349) Antibody: Negative (04/11 1349) Rubella: 32.70 (04/11 1349) RPR: Non Reactive (08/08 1003)  HBsAg: Negative (04/11 1349)  HIV: Non Reactive (08/08 1003)  GBS: Negative/-- (10/09 1137)  1 hr Glucola 127 Genetic screening  [place] disease  Anatomy US nml   Prenatal Transfer Tool  Maternal Diabetes: No Genetic Screening: Abnormal:  Results: Other: Maternal Ultrasounds/Referrals: Normal Fetal Ultrasounds or other Referrals:  None Maternal Substance Abuse:  No Significant Maternal Medications:  None Significant Maternal Lab Results:  Group B Strep negative Number of Prenatal Visits:greater than 3 verified prenatal visits Other Comments: Notify pediatrician baby has [place] disease.   No results found for this or any previous visit (from the past 24 hour(s)).  Patient Active Problem List   Diagnosis Date Noted   Anemia of pregnancy in third trimester 09/10/2022   Abnormal genetic test during pregnancy 07/16/2022   Supervision of high risk pregnancy, antepartum 05/13/2022   Sickle-cell trait (HCC) 01/29/2021   Hyperemesis 10/23/2020    Assessment/Plan:  Leah Vargas is a 32 y.o. G2P1001 at [redacted]w[redacted]d here for elective IOL due to hyperemesis.  #Labor: Induction initiated with Cytotec 50/25.  Repeat cervical exam in 4 hours and consider Foley balloon placement at that time #Pain: Per patient request.  #FWB: Cat I  #ID:  GBS negative  #MOF: Breastfeed; will try formula  #MOC:Outpatient IUD #Circ:  Yes   Deforest Hoyles, Medical Student  12/03/2022, 6:07 PM  Attending Attestation  I saw and evaluated the patient, performing the key elements of the service.I  personally performed or re-performed the history, physical exam, and medical decision making activities of this service and have verified that the service and findings  are accurately documented in the student's note. I developed the management plan that is described in the medical student's note, and I agree with the content, with my edits above.    Derrel Nip, MD Attending Family Medicine Physician, [facility], [facility]

## 2022-12-03 NOTE — Progress Notes (Signed)
Labor Progress Note Leah Vargas is a 32 y.o. G2P1001 at [redacted]w[redacted]d presented for eIOL d/t hyperemesis S: Comfortably resting upon arrival. Introduced ourselves (me and Dr. Judd Lien) as the night team.   Consented for circumcision.  O:  BP 116/73   Pulse 74   Temp 98.5 F (36.9 C) (Oral)   Resp 16   Ht 5\' 6"  (1.676 m)   Wt 87.9 kg   LMP 02/26/2022 (Exact Date)   BMI 31.26 kg/m  EFM: 135/moderate variability/accels present/no decels  CVE: Dilation: Closed Effacement (%): Thick Cervical Position: Posterior Station: Ballotable Presentation: Vertex Exam by:: m wilkins rnc   A&P: 32 y.o. G2P1001 [redacted]w[redacted]d admitted for eIOL #Labor: s/p dual cytotec, due to recheck in 3 hours consider Foley balloon placement at that time  #Pain: Comfortable #FWB: Cat I #GBS negative  **Baby has sickle cell disease confirmed via amniocentesis  Leah Forster, MD 8:58 PM

## 2022-12-03 NOTE — Progress Notes (Signed)
 Discussed with mom at bedside about circumcision.   Circumcision is a surgery that removes the skin that covers the tip of the penis, called the "foreskin." Circumcision is usually done when a boy is between 71 and 73 days old, sometimes up to 22-67 weeks old.  The most common reasons boys are circumcised include for cultural/religious beliefs or for parental preference (potentially easier to clean, so baby looks like daddy, etc).  There may be some medical benefits for circumcision:   Circumcised boys seem to have slightly lower rates of: ? Urinary tract infections (per the American Academy of Pediatrics an uncircumcised boy has a 1/100 chance of developing a UTI in the first year of life, a circumcised boy at a 02/998 chance of developing a UTI in the first year of life- a 10% reduction) ? Penis cancer (typically rare- an uncircumcised female has a 1 in 100,000 chance of developing cancer of the penis) ? Sexually transmitted infection (in endemic areas, including HIV, HPV and Herpes- circumcision does NOT protect against gonorrhea, chlamydia, trachomatis, or syphilis) ? Phimosis: a condition where that makes retraction of the foreskin over the glans impossible (0.4 per 1000 boys per year or 0.6% of boys are affected by their 15th birthday)  Boys and men who are not circumcised can reduce these extra risks by: ? Cleaning their penis well ? Using condoms during sex  What are the risks of circumcision?  As with any surgical procedure, there are risks and complications. In circumcision, complications are rare and usually minor, the most common being: ? Bleeding- risk is reduced by holding each clamp for 30 seconds prior to a cut being made, and by holding pressure after the procedure is done ? Infection- the penis is cleaned prior to the procedure, and the procedure is done under sterile technique ? Damage to the urethra or amputation of the penis  How is circumcision done in baby boys?  The baby  will be placed on a special table and the legs restrained for their safety. Numbing medication is injected into the penis, and the skin is cleansed with betadine to decrease the risk of infection.   What to expect:  The penis will look red and raw for 5-7 days as it heals. We expect scabbing around where the cut was made, as well as clear-pink fluid and some swelling of the penis right after the procedure. If your baby's circumcision starts to bleed or develops pus, please contact your pediatrician immediately.  All questions were answered and mother consented for the procedure.  Wyn Forster, MD FMOB Fellow, Faculty practice [facility], Center for [facility]

## 2022-12-03 NOTE — Progress Notes (Signed)
Labor Progress Note Leah Vargas is a 32 y.o. G2P1001 at [redacted]w[redacted]d presented for eIOL S: Coping well. Discussed risks, benefits, and indications for foley balloon; agreeable. Would like to try placement with a speculum.   O:  BP 116/73   Pulse 74   Temp 98.5 F (36.9 C) (Oral)   Resp 16   Ht 5\' 6"  (1.676 m)   Wt 87.9 kg   LMP 02/26/2022 (Exact Date)   BMI 31.26 kg/m  EFM: 135/moderate variability/accels present/no decels  CVE: Dilation: 1 Effacement (%): 50 Cervical Position: Posterior Station: -3 Presentation: Vertex Exam by:: Leanora Cover, MD   A&P: 32 y.o. G2P1001 [redacted]w[redacted]d admitted for eIOL #Labor: Progressing well. FB placed 53ml manually after failed attempt with speculum attempt. #Pain: IV fentanyl #FWB: Cat I #GBS negative   Wyn Forster, MD 11:47 PM

## 2022-12-04 ENCOUNTER — Encounter (HOSPITAL_COMMUNITY): Payer: Self-pay | Admitting: Family Medicine

## 2022-12-04 ENCOUNTER — Inpatient Hospital Stay (HOSPITAL_COMMUNITY): Payer: Managed Care, Other (non HMO) | Admitting: Anesthesiology

## 2022-12-04 DIAGNOSIS — O480 Post-term pregnancy: Secondary | ICD-10-CM

## 2022-12-04 DIAGNOSIS — Z3A40 40 weeks gestation of pregnancy: Secondary | ICD-10-CM

## 2022-12-04 DIAGNOSIS — O4593 Premature separation of placenta, unspecified, third trimester: Secondary | ICD-10-CM

## 2022-12-04 DIAGNOSIS — O210 Mild hyperemesis gravidarum: Secondary | ICD-10-CM

## 2022-12-04 DIAGNOSIS — O9902 Anemia complicating childbirth: Secondary | ICD-10-CM

## 2022-12-04 LAB — RPR: RPR Ser Ql: NONREACTIVE

## 2022-12-04 MED ORDER — PHENYLEPHRINE 80 MCG/ML (10ML) SYRINGE FOR IV PUSH (FOR BLOOD PRESSURE SUPPORT)
80.0000 ug | PREFILLED_SYRINGE | INTRAVENOUS | Status: DC | PRN
  Filled 2022-12-04: qty 10

## 2022-12-04 MED ORDER — SODIUM CHLORIDE 0.9% FLUSH
3.0000 mL | INTRAVENOUS | Status: DC | PRN
Start: 2022-12-04 — End: 2022-12-05

## 2022-12-04 MED ORDER — SODIUM CHLORIDE 0.9% FLUSH
10.0000 mL | Freq: Two times a day (BID) | INTRAVENOUS | Status: DC
  Administered 2022-12-04: 10 mL via INTRAVENOUS

## 2022-12-04 MED ORDER — WITCH HAZEL-GLYCERIN EX PADS: 1.0000 | MEDICATED_PAD | CUTANEOUS | Status: DC | PRN

## 2022-12-04 MED ORDER — METHYLERGONOVINE MALEATE 0.2 MG/ML IJ SOLN
INTRAMUSCULAR | Status: AC
  Filled 2022-12-04: qty 1

## 2022-12-04 MED ORDER — SODIUM CHLORIDE 0.9% FLUSH: 3.0000 mL | Freq: Two times a day (BID) | INTRAVENOUS | Status: DC

## 2022-12-04 MED ORDER — LACTATED RINGERS IV SOLN
500.0000 mL | Freq: Once | INTRAVENOUS | Status: AC
  Administered 2022-12-04: 500 mL via INTRAVENOUS

## 2022-12-04 MED ORDER — METHYLERGONOVINE MALEATE 0.2 MG/ML IJ SOLN
0.2000 mg | Freq: Once | INTRAMUSCULAR | Status: AC
  Administered 2022-12-04: 0.2 mg via INTRAMUSCULAR

## 2022-12-04 MED ORDER — IBUPROFEN 600 MG PO TABS
600.0000 mg | ORAL_TABLET | Freq: Four times a day (QID) | ORAL | Status: DC
  Administered 2022-12-04 – 2022-12-05 (×4): 600 mg via ORAL
  Filled 2022-12-04 (×5): qty 1

## 2022-12-04 MED ORDER — PRENATAL MULTIVITAMIN CH
1.0000 | ORAL_TABLET | Freq: Every day | ORAL | Status: DC
  Filled 2022-12-04 (×2): qty 1

## 2022-12-04 MED ORDER — EPHEDRINE 5 MG/ML INJ: 10.0000 mg | INTRAVENOUS | Status: DC | PRN

## 2022-12-04 MED ORDER — ONDANSETRON HCL 4 MG PO TABS: 4.0000 mg | ORAL_TABLET | ORAL | Status: DC | PRN

## 2022-12-04 MED ORDER — DIBUCAINE (PERIANAL) 1 % EX OINT: 1.0000 | TOPICAL_OINTMENT | CUTANEOUS | Status: DC | PRN

## 2022-12-04 MED ORDER — ONDANSETRON HCL 4 MG/2ML IJ SOLN
4.0000 mg | INTRAMUSCULAR | Status: DC | PRN
  Administered 2022-12-04: 4 mg via INTRAVENOUS
  Filled 2022-12-04: qty 2

## 2022-12-04 MED ORDER — ACETAMINOPHEN 325 MG PO TABS: 650.0000 mg | ORAL_TABLET | ORAL | Status: DC | PRN

## 2022-12-04 MED ORDER — METHYLERGONOVINE MALEATE 0.2 MG PO TABS
0.2000 mg | ORAL_TABLET | Freq: Three times a day (TID) | ORAL | Status: AC
  Administered 2022-12-04 (×2): 0.2 mg via ORAL
  Filled 2022-12-04 (×2): qty 1

## 2022-12-04 MED ORDER — COCONUT OIL OIL: 1.0000 | TOPICAL_OIL | Status: DC | PRN

## 2022-12-04 MED ORDER — BENZOCAINE-MENTHOL 20-0.5 % EX AERO
1.0000 | INHALATION_SPRAY | CUTANEOUS | Status: DC | PRN
  Filled 2022-12-04: qty 56

## 2022-12-04 MED ORDER — SIMETHICONE 80 MG PO CHEW: 80.0000 mg | CHEWABLE_TABLET | ORAL | Status: DC | PRN

## 2022-12-04 MED ORDER — ZOLPIDEM TARTRATE 5 MG PO TABS: 5.0000 mg | ORAL_TABLET | Freq: Every evening | ORAL | Status: DC | PRN

## 2022-12-04 MED ORDER — FENTANYL-BUPIVACAINE-NACL 0.5-0.125-0.9 MG/250ML-% EP SOLN
12.0000 mL/h | EPIDURAL | Status: DC | PRN
  Filled 2022-12-04: qty 250

## 2022-12-04 MED ORDER — DIPHENHYDRAMINE HCL 25 MG PO CAPS: 25.0000 mg | ORAL_CAPSULE | Freq: Four times a day (QID) | ORAL | Status: DC | PRN

## 2022-12-04 MED ORDER — SENNOSIDES-DOCUSATE SODIUM 8.6-50 MG PO TABS
2.0000 | ORAL_TABLET | ORAL | Status: DC
  Administered 2022-12-04 – 2022-12-05 (×2): 2 via ORAL
  Filled 2022-12-04 (×2): qty 2

## 2022-12-04 MED ORDER — PHENYLEPHRINE 80 MCG/ML (10ML) SYRINGE FOR IV PUSH (FOR BLOOD PRESSURE SUPPORT): 80.0000 ug | PREFILLED_SYRINGE | INTRAVENOUS | Status: DC | PRN

## 2022-12-04 MED ORDER — DIPHENHYDRAMINE HCL 50 MG/ML IJ SOLN
12.5000 mg | INTRAMUSCULAR | Status: DC | PRN
Start: 2022-12-04 — End: 2022-12-04

## 2022-12-04 NOTE — Discharge Summary (Shared)
Postpartum Discharge Summary  Date of Service updated***     Patient Name: Leah Vargas DOB: October 22, 1990 MRN: [id number]  Date of admission: 12/03/2022 Delivery date:12/04/2022 Delivering provider: Wyn Forster Date of discharge: 12/04/2022  Admitting diagnosis: Encounter for induction of labor [Z34.90] Intrauterine pregnancy: [redacted]w[redacted]d     Secondary diagnosis:  Principal Problem:   Encounter for induction of labor  Additional problems: ***    Discharge diagnosis: Term Pregnancy Delivered                                              Post partum procedures:{Postpartum procedures:23558} Augmentation: AROM, Cytotec, and IP Foley Complications: Placental Abruption  Hospital course: Induction of Labor With Vaginal Delivery   32 y.o. yo G2P1001 at [redacted]w[redacted]d was admitted to the hospital 12/03/2022 for induction of labor.  Indication for induction: Postdates and Elective.  Patient had a labor course with consistent decelerations after FB placement.  Was able to AROM and delivery quickly.  After delivery appears to have had an abruption which could have explained the non-reassuring tracings seen throughout labor course.   Membrane Rupture Time/Date: 5:59 AM,12/04/2022  Delivery Method:Vaginal, Spontaneous Operative Delivery:N/A  Episiotomy: None Lacerations:  2nd degree;Perineal Details of delivery can be found in separate delivery note.  Patient had a postpartum course complicated by***. Patient is discharged home 12/04/22.  Newborn Data: Birth date:12/04/2022 Birth time:6:43 AM Gender:Female Living status:Living Apgars:8 ,9  Weight:3570 g  Magnesium Sulfate received: {Mag received:30440022} BMZ received: No Rhophylac:N/A MMR:N/A T-DaP:Given prenatally Flu: No RSV Vaccine received: {RSV:31013} Transfusion:{Transfusion received:30440034}  Immunizations received: Immunization History  Administered Date(s) Administered   Moderna Sars-Covid-2 Vaccination 04/26/2019,  05/29/2019   Tdap 09/21/2022    Physical exam  Vitals:   12/04/22 0500 12/04/22 0532 12/04/22 0602 12/04/22 0633  BP:  114/70 123/83 117/79  Pulse:  80 98 (!) 106  Resp:  16 16 16   Temp: 97.9 F (36.6 C)     TempSrc: Oral     SpO2:  98% 100% 100%  Weight:      Height:       General: {Exam; general:21111117} Lochia: {Desc; appropriate/inappropriate:30686::"appropriate"} Uterine Fundus: {Desc; firm/soft:30687} Incision: {Exam; incision:21111123} DVT Evaluation: {Exam; dvt:2111122} Labs: Lab Results  Component Value Date   WBC 6.4 12/03/2022   HGB 10.4 (L) 12/03/2022   HCT 33.1 (L) 12/03/2022   MCV 77.2 (L) 12/03/2022   PLT 213 12/03/2022      Latest Ref Rng & Units 09/22/2022    9:46 AM  CMP  Glucose 70 - 99 mg/dL 95   BUN 6 - 20 mg/dL 5   Creatinine 1.02 - 5.85 mg/dL 2.77   Sodium 824 - 235 mmol/L 138   Potassium 3.5 - 5.1 mmol/L 3.9   Chloride 98 - 111 mmol/L 102   CO2 22 - 32 mmol/L 20   Calcium 8.9 - 10.3 mg/dL 8.9   Total Protein 6.5 - 8.1 g/dL 7.1   Total Bilirubin 0.3 - 1.2 mg/dL 1.0   Alkaline Phos 38 - 126 U/L 59   AST 15 - 41 U/L 30   ALT 0 - 44 U/L 20    Edinburgh Score:    06/18/2021    9:45 AM  Edinburgh Postnatal Depression Scale Screening Tool  I have been able to laugh and see the funny side of things. 0  I have looked forward  with enjoyment to things. 0  I have blamed myself unnecessarily when things went wrong. 0  I have been anxious or worried for no good reason. 0  I have felt scared or panicky for no good reason. 0  Things have been getting on top of me. 0  I have been so unhappy that I have had difficulty sleeping. 0  I have felt sad or miserable. 0  I have been so unhappy that I have been crying. 0  The thought of harming myself has occurred to me. 0  Edinburgh Postnatal Depression Scale Total 0   No data recorded  After visit meds:  Allergies as of 12/04/2022   No Known Allergies   Med Rec must be completed prior to using this  [facility]***        Discharge home in stable condition Infant Feeding: {Baby feeding:23562} Infant Disposition:{CHL IP OB HOME WITH WJXBJY:78295} Discharge instruction: per After Visit Summary and Postpartum booklet. Activity: Advance as tolerated. Pelvic rest for 6 weeks.  Diet: {OB AOZH:08657846} Future Appointments: Future Appointments  Date Time Provider Department Center  12/06/2022 10:45 AM [facility]2 [facility]  12/08/2022  9:55 AM Levie Heritage, DO [facility] None   Follow up Visit: Message sent to [facility] 11/2  Please schedule this patient for a In person postpartum visit in 4 weeks with the following provider: Any provider. Additional Postpartum F/U: n/a   Low risk pregnancy complicated by:  n/a Delivery mode:  Vaginal, Spontaneous Anticipated Birth Control:   IUD at [facility] visit   12/04/2022 Hessie Dibble, MD

## 2022-12-04 NOTE — Anesthesia Preprocedure Evaluation (Signed)
Anesthesia Evaluation  Patient identified by MRN, date of birth, ID band Patient awake    Reviewed: Allergy & Precautions, Patient's Chart, lab work & pertinent test results  Airway Mallampati: II       Dental   Pulmonary neg pulmonary ROS   Pulmonary exam normal        Cardiovascular negative cardio ROS Normal cardiovascular exam Rhythm:Regular     Neuro/Psych negative neurological ROS  negative psych ROS   GI/Hepatic Neg liver ROS,GERD  ,,  Endo/Other  Obesity  Renal/GU negative Renal ROS  negative genitourinary   Musculoskeletal negative musculoskeletal ROS (+)    Abdominal  (+) + obese  Peds  Hematology  (+) Blood dyscrasia, Sickle cell trait and anemia   Anesthesia Other Findings   Reproductive/Obstetrics (+) Pregnancy                              Anesthesia Physical Anesthesia Plan  ASA: 2  Anesthesia Plan: Epidural   Post-op Pain Management:    Induction: Intravenous  PONV Risk Score and Plan:   Airway Management Planned: Natural Airway  Additional Equipment: None and Fetal Monitoring  Intra-op Plan:   Post-operative Plan:   Informed Consent: I have reviewed the patients History and Physical, chart, labs and discussed the procedure including the risks, benefits and alternatives for the proposed anesthesia with the patient or authorized representative who has indicated his/her understanding and acceptance.       Plan Discussed with: Anesthesiologist  Anesthesia Plan Comments:          Anesthesia Quick Evaluation

## 2022-12-04 NOTE — Lactation Note (Signed)
This note was copied from a baby's chart. Lactation Consultation Note  Patient Name: Leah Vargas [id number] Date: 12/04/2022 Age:31 hours  Reason for consult: Initial assessment;Term  P2, [redacted]w[redacted]d  Initial LC visit to see P2 mother. Baby was skin to skin upon arrival. Mother has breastfeeding experience. Her feeding plan, as she did with last baby, is to breast, formula feed and pump ( when needed). She reports she had a good milk supply.  Mother encouraged to latch baby with feeding cues, place baby skin to skin if not latching, and call for assistance with breastfeeding as needed. Anticipate as baby approaches 24 hours of age, baby will breastfeed more often 8-12 plus times in 24 hours and may "cluster feed".     Mom made aware of O/P services, breastfeeding support groups,  and our phone # for post-discharge questions.     Maternal Data Has patient been taught Hand Expression?: No Does the patient have breastfeeding experience prior to this delivery?: Yes How long did the patient breastfeed?: 8 months  Feeding Mother's Current Feeding Choice: Breast Milk and Formula Nipple Type: Slow - flow   Interventions Interventions: Education;LC Services brochure  Discharge Pump: DEBP;Personal;[employer] Chiropractor)  Consult Status Consult Status: Follow-up Date: 12/05/22 Follow-up type: In-patient    Christella Hartigan M 12/04/2022, 5:49 PM

## 2022-12-04 NOTE — Anesthesia Procedure Notes (Signed)
Epidural Patient location during procedure: OB Start time: 12/04/2022 3:59 AM End time: 12/04/2022 4:09 AM  Staffing Anesthesiologist: Mal Amabile, MD Performed: anesthesiologist   Preanesthetic Checklist Completed: patient identified, IV checked, site marked, risks and benefits discussed, surgical consent, monitors and equipment checked, pre-op evaluation and timeout performed  Epidural Patient position: sitting Prep: DuraPrep and site prepped and draped Patient monitoring: continuous pulse ox and blood pressure Approach: midline Location: L3-L4 Injection technique: LOR air  Needle:  Needle type: Tuohy  Needle gauge: 17 G Needle length: 9 cm and 9 Needle insertion depth: 5 cm Catheter type: closed end flexible Catheter size: 19 Gauge Catheter at skin depth: 10 cm Test dose: negative and Other  Assessment Events: blood not aspirated, no cerebrospinal fluid, injection not painful, no injection resistance, no paresthesia and negative IV test  Additional Notes Patient identified. Risks and benefits discussed including failed block, incomplete  Pain control, post dural puncture headache, nerve damage, paralysis, blood pressure Changes, nausea, vomiting, reactions to medications-both toxic and allergic and post Partum back pain. All questions were answered. Patient expressed understanding and wished to proceed. Sterile technique was used throughout procedure. Epidural site was Dressed with sterile barrier dressing. No paresthesias, signs of intravascular injection Or signs of intrathecal spread were encountered.  Patient was more comfortable after the epidural was dosed. Please see RN's note for documentation of vital signs and FHR which are stable. Reason for block:procedure for pain

## 2022-12-04 NOTE — Anesthesia Postprocedure Evaluation (Signed)
Anesthesia Post Note  Patient: Leah Vargas  Procedure(s) Performed: AN AD HOC LABOR EPIDURAL     Patient location during evaluation: Mother Baby Anesthesia Type: Epidural Level of consciousness: awake Pain management: satisfactory to patient Vital Signs Assessment: post-procedure vital signs reviewed and stable Respiratory status: spontaneous breathing Cardiovascular status: stable Anesthetic complications: no  No notable events documented.  Last Vitals:  Vitals:   12/04/22 1002 12/04/22 1400  BP: 120/81 119/82  Pulse: 79 83  Resp: 18 18  Temp: 36.8 C 36.7 C  SpO2: 100% 100%    Last Pain:  Vitals:   12/04/22 1400  TempSrc: Oral  PainSc: 4    Pain Goal:                   [employer]

## 2022-12-04 NOTE — Progress Notes (Signed)
Labor Progress Note Leah Vargas is a 32 y.o. G2P1001 at [redacted]w[redacted]d presented for eIOL S: Breathing through contractions. FB out. Requesting epidural then would like AROM.   O:  BP 112/75   Pulse 91   Temp 98.2 F (36.8 C) (Oral)   Resp 16   Ht 5\' 6"  (1.676 m)   Wt 87.9 kg   LMP 02/26/2022 (Exact Date)   BMI 31.26 kg/m  EFM: 135/moderate variability/accels present/no decels  CVE: Dilation: 5 Effacement (%): 50 Cervical Position: Posterior Station: -2 Presentation: Vertex Exam by:: Leanora Cover, MD   A&P: 32 y.o. G2P1001 [redacted]w[redacted]d admitted for eIOL #Labor: Progressing well. Will AROM once comfortable with epidural per pt request #Pain: Called for epidural. Declined IV fentanyl in the meantime. #FWB: Cat I #GBS negative  Wyn Forster, MD 3:43 AM

## 2022-12-05 DIAGNOSIS — O4590 Premature separation of placenta, unspecified, unspecified trimester: Secondary | ICD-10-CM | POA: Insufficient documentation

## 2022-12-05 HISTORY — DX: Premature separation of placenta, unspecified, unspecified trimester: O45.90

## 2022-12-05 LAB — CBC
HCT: 27 % — ABNORMAL LOW (ref 36.0–46.0)
Hemoglobin: 8.9 g/dL — ABNORMAL LOW (ref 12.0–15.0)
MCH: 25.4 pg — ABNORMAL LOW (ref 26.0–34.0)
MCHC: 33 g/dL (ref 30.0–36.0)
MCV: 76.9 fL — ABNORMAL LOW (ref 80.0–100.0)
Platelets: 172 10*3/uL (ref 150–400)
RBC: 3.51 MIL/uL — ABNORMAL LOW (ref 3.87–5.11)
RDW: 20.9 % — ABNORMAL HIGH (ref 11.5–15.5)
WBC: 9 10*3/uL (ref 4.0–10.5)
nRBC: 0 % (ref 0.0–0.2)

## 2022-12-05 MED ORDER — FERROUS SULFATE 325 (65 FE) MG PO TABS
325.0000 mg | ORAL_TABLET | ORAL | Status: DC
  Administered 2022-12-05: 325 mg via ORAL
  Filled 2022-12-05: qty 1

## 2022-12-05 MED ORDER — SENNOSIDES-DOCUSATE SODIUM 8.6-50 MG PO TABS: 2.0000 | ORAL_TABLET | Freq: Two times a day (BID) | ORAL | 0 refills | Status: AC | PRN

## 2022-12-05 NOTE — Lactation Note (Signed)
This note was copied from a baby's chart. Lactation Consultation Note  Patient Name: Leah Vargas [id number] Date: 12/05/2022 Age:31 hours  Experienced breastfeeding mother requesting early discharge. Mother has been formula feeding. Her feeding plan was to provide and breast and formula to her baby. She has not requested lactation services today.  Feeding Mother's Current Feeding Choice: Breast Milk and Formula Nipple Type: Slow - flow    Consult Status Consult Status: Complete Date: 12/05/22    Omar Person 12/05/2022, 1:12 PM

## 2022-12-05 NOTE — Discharge Instructions (Signed)
WHAT TO LOOK OUT FOR: Fever of 100.4 or above Mastitis: feels like flu and breasts hurt Infection: increased pain, swelling or redness Blood clots golf ball size or larger Postpartum depression   Congratulations on your newest addition!

## 2022-12-06 ENCOUNTER — Other Ambulatory Visit: Payer: Managed Care, Other (non HMO)

## 2022-12-08 ENCOUNTER — Encounter: Payer: Managed Care, Other (non HMO) | Admitting: Family Medicine

## 2022-12-10 ENCOUNTER — Inpatient Hospital Stay (HOSPITAL_COMMUNITY): Payer: Managed Care, Other (non HMO)

## 2022-12-20 ENCOUNTER — Telehealth (HOSPITAL_COMMUNITY): Payer: Self-pay | Admitting: *Deleted

## 2022-12-20 NOTE — Telephone Encounter (Signed)
12/20/2022  Name: Sydnie Gluck MRN: [id number] DOB: 01/22/1991  Reason for Call:  Transition of Care Hospital Discharge Call  Contact Status: Patient Contact Status: Message  Language assistant needed:          Follow-Up Questions:    Inocente Salles Postnatal Depression Scale:  In the Past 7 Days:    PHQ2-9 Depression Scale:     Discharge Follow-up:    Post-discharge interventions: NA  Salena Saner, RN 12/20/2022  14:46

## 2022-12-27 NOTE — Telephone Encounter (Signed)
No additional notes to add.

## 2023-01-05 ENCOUNTER — Other Ambulatory Visit: Payer: Self-pay

## 2023-01-20 ENCOUNTER — Ambulatory Visit (INDEPENDENT_AMBULATORY_CARE_PROVIDER_SITE_OTHER): Payer: Managed Care, Other (non HMO) | Admitting: Family Medicine

## 2023-01-20 DIAGNOSIS — Z30430 Encounter for insertion of intrauterine contraceptive device: Secondary | ICD-10-CM | POA: Diagnosis not present

## 2023-01-20 MED ORDER — PARAGARD INTRAUTERINE COPPER IU IUD
1.0000 | INTRAUTERINE_SYSTEM | Freq: Once | INTRAUTERINE | Status: AC
  Administered 2023-01-20: 1 via INTRAUTERINE

## 2023-01-20 NOTE — Progress Notes (Signed)
Post Partum Visit Note  Leah Vargas is a 32 y.o. G23P2002 female who presents for a postpartum visit. She is 6 weeks postpartum following a normal spontaneous vaginal delivery.  I have fully reviewed the prenatal and intrapartum course. The delivery was at 40 gestational weeks.  Anesthesia: epidural. Postpartum course has been normal. Baby is doing well. Baby is feeding by breast. Bleeding red. Bowel function is normal. Bladder function is normal. Patient is not sexually active. Contraception method is IUD. Postpartum depression screening: negative.   The pregnancy intention screening data noted above was reviewed. Potential methods of contraception were discussed. The patient elected to proceed with No data recorded.   Edinburgh Postnatal Depression Scale - 01/20/23 1038       Edinburgh Postnatal Depression Scale:  In the Past 7 Days   I have been able to laugh and see the funny side of things. 0    I have looked forward with enjoyment to things. 0    I have blamed myself unnecessarily when things went wrong. 0    I have been anxious or worried for no good reason. 0    I have felt scared or panicky for no good reason. 0    Things have been getting on top of me. 0    I have been so unhappy that I have had difficulty sleeping. 0    I have felt sad or miserable. 0    I have been so unhappy that I have been crying. 0    The thought of harming myself has occurred to me. 0    Edinburgh Postnatal Depression Scale Total 0             Health Maintenance Due  Topic Date Due   COVID-19 Vaccine (3 - 2024-25 season) 10/03/2022   Cervical Cancer Screening (HPV/Pap Cotest)  03/22/2023    The following portions of the patient's history were reviewed and updated as appropriate: allergies, current medications, past family history, past medical history, past social history, past surgical history, and problem list.  Review of Systems Pertinent items are noted in  HPI.  Objective:  BP 96/62 (BP Location: Left Arm, Patient Position: Sitting, Cuff Size: Normal)   Pulse 69   Wt 175 lb (79.4 kg)   LMP 02/26/2022 (Exact Date)   BMI 28.25 kg/m    General:  alert, cooperative, and no distress   Breasts:  not indicated  Lungs: clear to auscultation bilaterally  Heart:  regular rate and rhythm, S1, S2 normal, no murmur, click, rub or gallop  Abdomen: soft, non-tender; bowel sounds normal; no masses,  no organomegaly   Wound N/a  GU exam:  normal       Assessment:   1. Postpartum exam (Primary)  2. Encounter for IUD insertion   Plan:   Essential components of care per ACOG recommendations:  1.  Mood and well being: Patient with negative depression screening today. Reviewed local resources for support.  - Patient tobacco use? No.   - hx of drug use? No.    2. Infant care and feeding:  -Patient currently breastmilk feeding? Yes. Reviewed importance of draining breast regularly to support lactation.  -Social determinants of health (SDOH) reviewed in EPIC. No concerns  3. Sexuality, contraception and birth spacing - Patient does not want a pregnancy in the next year.  - Reviewed reproductive life planning. Reviewed contraceptive methods based on pt preferences and effectiveness.  Patient desired IUD or IUS today.   -  Discussed birth spacing of 18 months  4. Sleep and fatigue -Encouraged family/partner/community support of 4 hrs of uninterrupted sleep to help with mood and fatigue  5. Physical Recovery  - Discussed patients delivery and complications. She describes her labor as good. - Patient had a Vaginal, no problems at delivery. Patient had a  no  laceration. Perineal healing reviewed. Patient expressed understanding - Patient has urinary incontinence? No. - Patient is safe to resume physical and sexual activity  6.  Health Maintenance - HM due items addressed Yes - Last pap smear No results found for: "DIAGPAP" Pap smear not done at  today's visit. Will do at string check -Breast Cancer screening indicated? No.   7. Chronic Disease/Pregnancy Condition follow up: None  - PCP follow up  Levie Heritage, DO Center for [facility], [facility]

## 2023-01-20 NOTE — Progress Notes (Signed)
IUD Procedure Note Patient identified, informed consent performed, signed copy in chart, time out was performed.  Urine pregnancy test negative.  Speculum placed in the vagina.  Cervix visualized.  Cleaned with Betadine x 2.  Paracervical block placed with Lidocaine 2% with epinephrine 10mL spread between the 12 o'clock. Cervix grasped anteriorly with a single tooth tenaculum.  Uterus sounded to 7 cm.  Paragard  IUD placed per manufacturer's recommendations.  Strings trimmed to 3 cm. Tenaculum was removed, good hemostasis noted.  Patient tolerated procedure well.   Patient given post procedure instructions and Paragard care card with expiration date.  Patient is asked to check IUD strings periodically and follow up in 4-6 weeks for IUD check.

## 2023-02-16 ENCOUNTER — Ambulatory Visit: Payer: Managed Care, Other (non HMO) | Admitting: Family Medicine

## 2023-03-09 ENCOUNTER — Ambulatory Visit: Payer: Managed Care, Other (non HMO) | Admitting: Family Medicine

## 2023-03-15 ENCOUNTER — Other Ambulatory Visit (HOSPITAL_COMMUNITY)
Admission: RE | Admit: 2023-03-15 | Discharge: 2023-03-15 | Disposition: A | Discharge status: Home / Self Care | Payer: Medicaid Other | Source: Ambulatory Visit

## 2023-03-15 ENCOUNTER — Ambulatory Visit (INDEPENDENT_AMBULATORY_CARE_PROVIDER_SITE_OTHER): Payer: Medicaid Other

## 2023-03-15 VITALS — BP 102/64 | HR 71 | Wt 176.0 lb

## 2023-03-15 DIAGNOSIS — Z124 Encounter for screening for malignant neoplasm of cervix: Secondary | ICD-10-CM

## 2023-03-15 DIAGNOSIS — Z30431 Encounter for routine checking of intrauterine contraceptive device: Secondary | ICD-10-CM | POA: Insufficient documentation

## 2023-03-15 DIAGNOSIS — Z113 Encounter for screening for infections with a predominantly sexual mode of transmission: Secondary | ICD-10-CM | POA: Insufficient documentation

## 2023-03-15 NOTE — Progress Notes (Signed)
    GYNECOLOGY OFFICE ENCOUNTER NOTE  History:  33 y.o. [id number] here today for today for IUD string check; Paragard IUD was placed  01/20/2023. No complaints about the IUD, no concerning side effects. Normal period, light.  Reports some cramping. She denies vaginal discharge or pain/discomfort during sex. Reports some spotting, with wiping, ~ 1 week prior to menstrual cycle that lasts the day.  No correlation with sex or abnormal vaginal discharge.   The following portions of the patient's history were reviewed and updated as appropriate: allergies, current medications, past family history, past medical history, past social history, past surgical history and problem list. Last pap smear on Feb 2022 was normal, negative HRHPV.  Review of Systems:  Pertinent items are noted in HPI.   Objective:  Physical Exam Blood pressure 102/64, pulse 71, weight 176 lb (79.8 kg), last menstrual period 02/13/2023, currently breastfeeding. CONSTITUTIONAL: Well-developed, well-nourished female in no acute distress.  NEUROLOGIC: Alert and oriented to person, place, and time. Normal reflexes, muscle tone coordination.  PSYCHIATRIC: Normal mood and affect. Normal behavior. Normal judgment and thought content. CARDIOVASCULAR: Normal heart rate noted RESPIRATORY: Effort and breath sounds normal, no problems with respiration noted ABDOMEN: Soft, no distention noted.   PELVIC: Normal appearing external genitalia; normal appearing vaginal mucosa and cervix.  Blood in vault c/w menses. IUD strings visualized, about 2 cm in length outside cervix. Pap collected with brush and spatula. Done in the presence of a chaperone-Brenda, RN  Assessment & Plan:  IUD in Place Pap completed STD Screening  -Keep IUD in place for 10 years or remove earlier if desiring pregnancy. -Reassured that some cramping can be normal.  Instructed to use ibuprofen/tylenol if lasting longer in duration.  Patient to call office if cramping  occurring outside of menstrual cycle parameters and with extended duration. - Informed that she should take UPT if missed menses. -Pap collected as well as GC/Ct per patient request. -Results to be sent via mychart. -Return prn or yearly.   Cherre Robins MSN, CNM Advanced Practice Provider, [facility]

## 2023-03-18 LAB — CYTOLOGY - PAP
Chlamydia: NEGATIVE
Comment: NEGATIVE
Comment: NEGATIVE
Comment: NORMAL
Diagnosis: NEGATIVE
High risk HPV: NEGATIVE
Neisseria Gonorrhea: NEGATIVE

## 2023-04-29 ENCOUNTER — Other Ambulatory Visit: Payer: Self-pay | Admitting: Obstetrics and Gynecology

## 2023-04-29 DIAGNOSIS — D571 Sickle-cell disease without crisis: Secondary | ICD-10-CM
